# Patient Record
Sex: Male | Born: 1935
Health system: Southern US, Community
[De-identification: ages and names within clinical notes are randomized; demographics above are authoritative.]

## PROBLEM LIST (undated history)

## (undated) DIAGNOSIS — G8929 Other chronic pain: Secondary | ICD-10-CM

## (undated) DIAGNOSIS — I2510 Atherosclerotic heart disease of native coronary artery without angina pectoris: Secondary | ICD-10-CM

## (undated) DIAGNOSIS — N183 Chronic kidney disease, stage 3 unspecified: Secondary | ICD-10-CM

## (undated) DIAGNOSIS — I10 Essential (primary) hypertension: Secondary | ICD-10-CM

## (undated) DIAGNOSIS — E1129 Type 2 diabetes mellitus with other diabetic kidney complication: Secondary | ICD-10-CM

## (undated) DIAGNOSIS — R110 Nausea: Secondary | ICD-10-CM

## (undated) DIAGNOSIS — K219 Gastro-esophageal reflux disease without esophagitis: Secondary | ICD-10-CM

## (undated) DIAGNOSIS — H9190 Unspecified hearing loss, unspecified ear: Secondary | ICD-10-CM

## (undated) DIAGNOSIS — I472 Ventricular tachycardia: Secondary | ICD-10-CM

## (undated) DIAGNOSIS — J069 Acute upper respiratory infection, unspecified: Secondary | ICD-10-CM

## (undated) DIAGNOSIS — H547 Unspecified visual loss: Secondary | ICD-10-CM

## (undated) DIAGNOSIS — E119 Type 2 diabetes mellitus without complications: Secondary | ICD-10-CM

## (undated) DIAGNOSIS — I4729 Other ventricular tachycardia: Secondary | ICD-10-CM

## (undated) DIAGNOSIS — E7800 Pure hypercholesterolemia, unspecified: Secondary | ICD-10-CM

## (undated) DIAGNOSIS — I4430 Unspecified atrioventricular block: Secondary | ICD-10-CM

## (undated) HISTORY — DX: Essential (primary) hypertension: I10

## (undated) HISTORY — DX: Chronic kidney disease, stage 3 (moderate): N18.3

## (undated) HISTORY — DX: Unspecified hearing loss, unspecified ear: H91.90

## (undated) HISTORY — DX: Atherosclerotic heart disease of native coronary artery without angina pectoris: I25.10

## (undated) HISTORY — DX: Pure hypercholesterolemia, unspecified: E78.00

## (undated) HISTORY — DX: Unspecified visual loss: H54.7

## (undated) HISTORY — DX: Acute upper respiratory infection, unspecified: J06.9

## (undated) HISTORY — PX: HERNIA REPAIR: SHX51

## (undated) HISTORY — DX: Gastro-esophageal reflux disease without esophagitis: K21.9

## (undated) HISTORY — DX: Type 2 diabetes mellitus with other diabetic kidney complication: E11.29

## (undated) HISTORY — DX: Chronic kidney disease, stage 3 unspecified: N18.30

## (undated) HISTORY — DX: Type 2 diabetes mellitus without complications: E11.9

## (undated) HISTORY — DX: Other chronic pain: G89.29

## (undated) HISTORY — PX: COLON SURGERY: SHX602

---

## 1980-05-24 HISTORY — PX: FOOT SURGERY: SHX648

## 1997-05-24 DIAGNOSIS — I2510 Atherosclerotic heart disease of native coronary artery without angina pectoris: Secondary | ICD-10-CM

## 1997-05-24 HISTORY — DX: Atherosclerotic heart disease of native coronary artery without angina pectoris: I25.10

## 1997-05-24 HISTORY — PX: TRANSURETHRAL PROSTATECTOMY WITH GYRUS INSTRUMENTS: SHX6153

## 1998-03-20 ENCOUNTER — Encounter: Payer: Self-pay | Admitting: Emergency Medicine

## 1998-03-20 ENCOUNTER — Inpatient Hospital Stay (HOSPITAL_COMMUNITY): Admission: EM | Admit: 1998-03-20 | Discharge: 1998-03-26 | Payer: Self-pay | Admitting: Emergency Medicine

## 1998-03-21 ENCOUNTER — Encounter: Payer: Self-pay | Admitting: *Deleted

## 1998-05-24 HISTORY — PX: SHOULDER SURGERY: SHX246

## 1998-05-24 HISTORY — PX: KNEE ARTHROSCOPY: SUR90

## 1998-07-31 ENCOUNTER — Inpatient Hospital Stay (HOSPITAL_COMMUNITY): Admission: AD | Admit: 1998-07-31 | Discharge: 1998-08-02 | Payer: Self-pay | Admitting: Cardiology

## 1998-08-01 ENCOUNTER — Encounter: Payer: Self-pay | Admitting: Cardiology

## 1998-10-07 ENCOUNTER — Encounter: Payer: Self-pay | Admitting: Emergency Medicine

## 1998-10-07 ENCOUNTER — Emergency Department (HOSPITAL_COMMUNITY): Admission: EM | Admit: 1998-10-07 | Discharge: 1998-10-07 | Payer: Self-pay | Admitting: Emergency Medicine

## 1998-10-17 ENCOUNTER — Encounter: Payer: Self-pay | Admitting: Cardiology

## 1998-10-17 ENCOUNTER — Ambulatory Visit (HOSPITAL_COMMUNITY): Admission: RE | Admit: 1998-10-17 | Discharge: 1998-10-17 | Payer: Self-pay | Admitting: Cardiology

## 2006-05-24 HISTORY — PX: NECK SURGERY: SHX720

## 2008-06-20 ENCOUNTER — Ambulatory Visit (HOSPITAL_COMMUNITY): Admission: RE | Admit: 2008-06-20 | Discharge: 2008-06-20 | Payer: Self-pay | Admitting: Ophthalmology

## 2010-09-07 LAB — BASIC METABOLIC PANEL
BUN: 18 mg/dL (ref 6–23)
CO2: 30 mEq/L (ref 19–32)
Calcium: 9 mg/dL (ref 8.4–10.5)
Chloride: 104 mEq/L (ref 96–112)
Creatinine, Ser: 1.5 mg/dL (ref 0.4–1.5)
GFR calc Af Amer: 56 mL/min — ABNORMAL LOW (ref >60)
GFR calc non Af Amer: 46 mL/min — ABNORMAL LOW (ref >60)
Glucose, Bld: 202 mg/dL — ABNORMAL HIGH (ref 70–99)
Potassium: 4.1 mEq/L (ref 3.5–5.1)
Sodium: 139 mEq/L (ref 135–145)

## 2010-09-07 LAB — HEMOGLOBIN AND HEMATOCRIT, BLOOD: HCT: 42.2 % (ref 39.0–52.0)

## 2010-09-07 LAB — GLUCOSE, CAPILLARY: Glucose-Capillary: 148 mg/dL — ABNORMAL HIGH (ref 70–99)

## 2010-09-09 ENCOUNTER — Ambulatory Visit (HOSPITAL_COMMUNITY)
Admission: RE | Admit: 2010-09-09 | Discharge: 2010-09-09 | Disposition: A | Discharge status: Home / Self Care | Payer: Medicare Other | Source: Ambulatory Visit | Attending: Gastroenterology | Admitting: Gastroenterology

## 2010-09-09 ENCOUNTER — Other Ambulatory Visit: Payer: Self-pay | Admitting: Gastroenterology

## 2010-09-09 DIAGNOSIS — E119 Type 2 diabetes mellitus without complications: Secondary | ICD-10-CM | POA: Insufficient documentation

## 2010-09-09 DIAGNOSIS — E785 Hyperlipidemia, unspecified: Secondary | ICD-10-CM | POA: Insufficient documentation

## 2010-09-09 DIAGNOSIS — Z01810 Encounter for preprocedural cardiovascular examination: Secondary | ICD-10-CM | POA: Insufficient documentation

## 2010-09-09 DIAGNOSIS — Z01812 Encounter for preprocedural laboratory examination: Secondary | ICD-10-CM | POA: Insufficient documentation

## 2010-09-09 DIAGNOSIS — I10 Essential (primary) hypertension: Secondary | ICD-10-CM | POA: Insufficient documentation

## 2010-09-09 DIAGNOSIS — K862 Cyst of pancreas: Secondary | ICD-10-CM | POA: Insufficient documentation

## 2010-09-09 DIAGNOSIS — I252 Old myocardial infarction: Secondary | ICD-10-CM | POA: Insufficient documentation

## 2010-09-09 DIAGNOSIS — R1310 Dysphagia, unspecified: Secondary | ICD-10-CM | POA: Insufficient documentation

## 2010-09-09 DIAGNOSIS — Z79899 Other long term (current) drug therapy: Secondary | ICD-10-CM | POA: Insufficient documentation

## 2010-09-09 DIAGNOSIS — R1011 Right upper quadrant pain: Secondary | ICD-10-CM | POA: Insufficient documentation

## 2010-09-09 DIAGNOSIS — K219 Gastro-esophageal reflux disease without esophagitis: Secondary | ICD-10-CM | POA: Insufficient documentation

## 2010-09-09 DIAGNOSIS — Z7982 Long term (current) use of aspirin: Secondary | ICD-10-CM | POA: Insufficient documentation

## 2010-09-09 DIAGNOSIS — D371 Neoplasm of uncertain behavior of stomach: Secondary | ICD-10-CM | POA: Insufficient documentation

## 2010-09-09 LAB — GLUCOSE, CAPILLARY
Glucose-Capillary: 124 mg/dL — ABNORMAL HIGH (ref 70–99)
Glucose-Capillary: 69 mg/dL — ABNORMAL LOW (ref 70–99)
Glucose-Capillary: 156 mg/dL — ABNORMAL HIGH (ref 70–99)

## 2010-11-09 ENCOUNTER — Other Ambulatory Visit (HOSPITAL_COMMUNITY): Payer: Self-pay | Admitting: Gastroenterology

## 2010-11-20 ENCOUNTER — Encounter (HOSPITAL_COMMUNITY)
Admission: RE | Admit: 2010-11-20 | Discharge: 2010-11-20 | Disposition: A | Discharge status: Home / Self Care | Payer: Medicare Other | Source: Ambulatory Visit | Attending: Gastroenterology | Admitting: Gastroenterology

## 2010-11-20 DIAGNOSIS — E119 Type 2 diabetes mellitus without complications: Secondary | ICD-10-CM | POA: Insufficient documentation

## 2010-11-20 DIAGNOSIS — R110 Nausea: Secondary | ICD-10-CM | POA: Insufficient documentation

## 2010-11-20 DIAGNOSIS — R109 Unspecified abdominal pain: Secondary | ICD-10-CM | POA: Insufficient documentation

## 2010-11-26 ENCOUNTER — Encounter (HOSPITAL_COMMUNITY): Admission: RE | Admit: 2010-11-26 | Payer: Medicare Other | Source: Ambulatory Visit

## 2010-11-26 ENCOUNTER — Encounter (HOSPITAL_COMMUNITY)
Admission: RE | Admit: 2010-11-26 | Discharge: 2010-11-26 | Disposition: A | Discharge status: Home / Self Care | Payer: Medicare Other | Source: Ambulatory Visit | Attending: Gastroenterology | Admitting: Gastroenterology

## 2010-11-26 ENCOUNTER — Other Ambulatory Visit (HOSPITAL_COMMUNITY): Payer: Self-pay | Admitting: Gastroenterology

## 2010-11-26 ENCOUNTER — Encounter (HOSPITAL_COMMUNITY): Payer: Self-pay

## 2010-11-26 HISTORY — DX: Nausea: R11.0

## 2010-11-26 MED ORDER — TECHNETIUM TC 99M MEBROFENIN IV KIT
5.4000 | PACK | Freq: Once | INTRAVENOUS | Status: AC | PRN
  Administered 2010-11-26: 5.4 via INTRAVENOUS

## 2010-11-26 MED ORDER — SINCALIDE 5 MCG IJ SOLR: 0.0200 ug/kg | Freq: Once | INTRAMUSCULAR | Status: DC

## 2011-02-12 ENCOUNTER — Other Ambulatory Visit: Payer: Self-pay | Admitting: Gastroenterology

## 2012-01-17 ENCOUNTER — Ambulatory Visit: Payer: Medicare Other | Attending: Family Medicine | Admitting: Physical Therapy

## 2012-01-17 DIAGNOSIS — M542 Cervicalgia: Secondary | ICD-10-CM | POA: Insufficient documentation

## 2012-01-17 DIAGNOSIS — IMO0001 Reserved for inherently not codable concepts without codable children: Secondary | ICD-10-CM | POA: Insufficient documentation

## 2012-01-17 DIAGNOSIS — R42 Dizziness and giddiness: Secondary | ICD-10-CM | POA: Insufficient documentation

## 2012-01-17 DIAGNOSIS — M2569 Stiffness of other specified joint, not elsewhere classified: Secondary | ICD-10-CM | POA: Insufficient documentation

## 2012-01-21 ENCOUNTER — Ambulatory Visit: Payer: Medicare Other | Admitting: Physical Therapy

## 2012-01-26 ENCOUNTER — Ambulatory Visit: Payer: Medicare Other | Attending: Family Medicine | Admitting: Physical Therapy

## 2012-01-26 DIAGNOSIS — R42 Dizziness and giddiness: Secondary | ICD-10-CM | POA: Insufficient documentation

## 2012-01-26 DIAGNOSIS — M2569 Stiffness of other specified joint, not elsewhere classified: Secondary | ICD-10-CM | POA: Insufficient documentation

## 2012-01-26 DIAGNOSIS — M542 Cervicalgia: Secondary | ICD-10-CM | POA: Insufficient documentation

## 2012-01-26 DIAGNOSIS — IMO0001 Reserved for inherently not codable concepts without codable children: Secondary | ICD-10-CM | POA: Insufficient documentation

## 2012-01-28 ENCOUNTER — Ambulatory Visit: Payer: Medicare Other | Admitting: Physical Therapy

## 2012-02-02 ENCOUNTER — Ambulatory Visit: Payer: Medicare Other | Admitting: Physical Therapy

## 2012-02-04 ENCOUNTER — Ambulatory Visit: Payer: Medicare Other | Admitting: Physical Therapy

## 2012-02-08 ENCOUNTER — Ambulatory Visit: Payer: Medicare Other | Admitting: Physical Therapy

## 2012-02-15 ENCOUNTER — Ambulatory Visit: Payer: Medicare Other | Admitting: Physical Therapy

## 2012-02-22 ENCOUNTER — Ambulatory Visit: Payer: Medicare Other | Attending: Family Medicine | Admitting: Physical Therapy

## 2012-02-22 DIAGNOSIS — M2569 Stiffness of other specified joint, not elsewhere classified: Secondary | ICD-10-CM | POA: Insufficient documentation

## 2012-02-22 DIAGNOSIS — M542 Cervicalgia: Secondary | ICD-10-CM | POA: Insufficient documentation

## 2012-02-22 DIAGNOSIS — R42 Dizziness and giddiness: Secondary | ICD-10-CM | POA: Insufficient documentation

## 2012-02-22 DIAGNOSIS — IMO0001 Reserved for inherently not codable concepts without codable children: Secondary | ICD-10-CM | POA: Insufficient documentation

## 2012-05-24 HISTORY — PX: PACEMAKER INSERTION: SHX728

## 2012-11-02 ENCOUNTER — Encounter: Payer: Self-pay | Admitting: Diagnostic Neuroimaging

## 2012-11-03 ENCOUNTER — Ambulatory Visit: Payer: Medicare Other | Admitting: Diagnostic Neuroimaging

## 2012-11-21 ENCOUNTER — Ambulatory Visit: Payer: Medicare Other | Admitting: Diagnostic Neuroimaging

## 2017-03-14 ENCOUNTER — Ambulatory Visit: Payer: Medicare Other | Admitting: Neurology

## 2018-01-09 ENCOUNTER — Encounter: Payer: Self-pay | Admitting: Family Medicine

## 2018-01-09 ENCOUNTER — Ambulatory Visit (INDEPENDENT_AMBULATORY_CARE_PROVIDER_SITE_OTHER): Payer: Medicare Other | Admitting: Family Medicine

## 2018-01-09 VITALS — BP 108/67 | Temp 97.0°F | Ht 72.0 in | Wt 216.1 lb

## 2018-01-09 DIAGNOSIS — Z125 Encounter for screening for malignant neoplasm of prostate: Secondary | ICD-10-CM | POA: Diagnosis not present

## 2018-01-09 DIAGNOSIS — E7800 Pure hypercholesterolemia, unspecified: Secondary | ICD-10-CM | POA: Diagnosis not present

## 2018-01-09 DIAGNOSIS — G119 Hereditary ataxia, unspecified: Secondary | ICD-10-CM | POA: Diagnosis not present

## 2018-01-09 DIAGNOSIS — E1140 Type 2 diabetes mellitus with diabetic neuropathy, unspecified: Secondary | ICD-10-CM | POA: Diagnosis not present

## 2018-01-09 DIAGNOSIS — E1149 Type 2 diabetes mellitus with other diabetic neurological complication: Secondary | ICD-10-CM

## 2018-01-09 DIAGNOSIS — Z950 Presence of cardiac pacemaker: Secondary | ICD-10-CM

## 2018-01-09 DIAGNOSIS — I25118 Atherosclerotic heart disease of native coronary artery with other forms of angina pectoris: Secondary | ICD-10-CM

## 2018-01-09 LAB — BAYER DCA HB A1C WAIVED: HB A1C: 7.3 % — AB (ref <7.0)

## 2018-01-09 MED ORDER — CELECOXIB 200 MG PO CAPS: 200.0000 mg | ORAL_CAPSULE | Freq: Every day | ORAL | 2 refills | Status: DC

## 2018-01-09 MED ORDER — PRAVASTATIN SODIUM 20 MG PO TABS: 20.0000 mg | ORAL_TABLET | Freq: Every day | ORAL | 1 refills | Status: DC

## 2018-01-09 MED ORDER — HYDROCHLOROTHIAZIDE 25 MG PO TABS: 25.0000 mg | ORAL_TABLET | Freq: Every day | ORAL | 1 refills | Status: DC

## 2018-01-09 MED ORDER — INSULIN DETEMIR 100 UNIT/ML ~~LOC~~ SOLN: 50.0000 [IU] | SUBCUTANEOUS | 1 refills | Status: DC

## 2018-01-09 MED ORDER — EZETIMIBE 10 MG PO TABS: 10.0000 mg | ORAL_TABLET | Freq: Every day | ORAL | 1 refills | Status: DC

## 2018-01-09 MED ORDER — GLIPIZIDE 10 MG PO TABS: 10.0000 mg | ORAL_TABLET | Freq: Two times a day (BID) | ORAL | 1 refills | Status: DC

## 2018-01-09 MED ORDER — LISINOPRIL 10 MG PO TABS: ORAL_TABLET | ORAL | 1 refills | Status: DC

## 2018-01-09 NOTE — Progress Notes (Signed)
Subjective:  Patient ID: Thomas Maldonado, male    DOB: 1934/04/17  Age: 84 y.o. MRN: [id number]  CC: New Patient (Initial Visit) (pt here today to establish care)   HPI Thomas Maldonado presents for new patient office visit.  He has been seen over the last few years by multiple physicians for dizziness and headache.  No body aches but not able to determine the cause or offer treatment that has been useful.  He has had multiple tests for this and is very frustrated and that it has limited his lifestyle.  He takes oxycodone for chronic pain.  This pain is primarily in the neck and he has had a fusion and laminectomy in the cervical spine region.  That pain is very well relieved he states but the headache radiates from the neck and up into the posterior scalp and further all the way to the frontal scalp.  The headaches are rather constant.  They are made worse when he when he sits up from laying position.  He does seem to get some relief from laying down.  Of note is that the dizziness accompanies the headache when he leans forward and sits up.  The dizziness is described as an off-balance sensation he feels swimmy headed feels like he is going to fall over.  He says that sometimes it feels like things are moving but generally not.  Headache is moderately severe it is a pressure.  He notes that when he tries to eat something his headache and dizziness will actually get worse.  That is frequently accompanied by some nausea.  Patient has tried going off of the oxycodone.  That has not helped.  He resumed the medicine due to the intensity of his arthritis pain.  Additionally he has had multiple scans.  He cannot have an MRI due to pacemaker placement 5 years ago.  He has had a CT angiogram of the brain approximately 3 years ago.  That report was reviewed showing essentially normal vertebrals as well as intracranial circulation.  The patient was having similar symptoms at that time according to the indication for the  test. He stopped smoking and alcohol 35 years ago.  With regard to the patient's diabetes this has been stable.  He takes 50 units of Levemir daily.  He also has NovoLog to take.  He uses it as needed 6 units for sugar over 250 and 10 units for sugar over 300.  That happens 2-3 times a week apparently.  He denies any low blood sugars but comments that today he has not eaten and he is feeling like his sugar might drop if he does not get something to eat soon.He is not following a strict diabetic diet at this point.  Depression screen PHQ 2/9 01/09/2018  Decreased Interest 0  Down, Depressed, Hopeless 1  PHQ - 2 Score 1    History Thomas Maldonado has a past medical history of Abdominal pain, Acute upper respiratory infections of unspecified site, Chronic kidney disease, stage III (moderate) ([place]), Coronary atherosclerosis of unspecified type of vessel, native or graft (1999), Diabetes mellitus without complication ([place]), Dizziness and giddiness (2007), Esophageal reflux, Headache(784.0), Heart disease, unspecified, History of colonoscopy (02/2011), Hypertension, Myocardial infarction ([place]), Nausea, Other chronic pain, Problems with hearing, Problems with sight, Pure hypercholesterolemia, Type II or unspecified type diabetes mellitus with neurological manifestations, not stated as uncontrolled(250.60), and Type II or unspecified type diabetes mellitus with renal manifestations, not stated as uncontrolled(250.40).   He has a past surgical  history that includes Neck surgery (2008); Shoulder surgery (2000); Knee arthroscopy (Right, 2000); Transurethral prostatectomy with gyrus instruments (1999); Foot surgery (Left, 1982); Hernia repair; Colon surgery; and Pacemaker insertion (2014).   His family history includes Cancer in his brother, brother, brother, brother, brother, brother, sister, sister, sister, and sister; Diabetes in his brother, brother, brother, father, sister, and sister; Heart disease in his brother,  brother, brother, brother, brother, brother, sister, sister, sister, and sister.He reports that he quit smoking about 36 years ago. His smoking use included cigarettes. He has never used smokeless tobacco. He reports that he does not drink alcohol or use drugs.    ROS Review of Systems  Constitutional: Positive for appetite change. Negative for fatigue and unexpected weight change.  HENT: Negative.   Eyes: Negative for visual disturbance.  Respiratory: Negative for cough and shortness of breath.   Cardiovascular: Negative for chest pain and leg swelling.  Gastrointestinal: Negative for abdominal pain, diarrhea, nausea and vomiting.  Genitourinary: Negative for difficulty urinating.  Musculoskeletal: Negative for arthralgias and myalgias.  Skin: Negative for rash.  Neurological: Positive for dizziness, light-headedness and headaches.  Psychiatric/Behavioral: Negative for sleep disturbance.    Objective:  BP 108/67   Temp (!) 97 F (36.1 C) (Oral)   Ht 6' (1.829 m)   Wt 216 lb 2 oz (98 kg)   BMI 29.31 kg/m   BP Readings from Last 3 Encounters:  01/09/18 108/67    Wt Readings from Last 3 Encounters:  01/09/18 216 lb 2 oz (98 kg)     Physical Exam  Constitutional: He is oriented to person, place, and time. He appears well-developed and well-nourished. No distress.  HENT:  Head: Normocephalic and atraumatic.  Right Ear: External ear normal.  Left Ear: External ear normal.  Nose: Nose normal.  Mouth/Throat: Oropharynx is clear and moist.  Eyes: Pupils are equal, round, and reactive to light. Conjunctivae and EOM are normal.  Neck: Normal range of motion. Neck supple.  Cardiovascular: Normal rate, regular rhythm and normal heart sounds.  No murmur heard. Pulmonary/Chest: Effort normal and breath sounds normal. No respiratory distress. He has no wheezes. He has no rales.  Abdominal: Soft. There is no tenderness.  Musculoskeletal: Normal range of motion.  Neurological: He  is alert and oriented to person, place, and time. He has normal reflexes.  Patient is very articulate.  No signs of memory impairment or loss of cognition.  This is based on extensive interview.  Both with the patient and the daughter who is accompanying and gives supplemental history.  Skin: Skin is warm and dry.  Psychiatric: He has a normal mood and affect. His behavior is normal. Judgment and thought content normal.      Assessment & Plan:   Thomas Maldonado was seen today for new patient (initial visit).  Diagnoses and all orders for this visit:  Cerebral ataxia ([place]) -     Ambulatory referral to Neurology -     Folate -     Vitamin B12 -     VITAMIN D 25 Hydroxy (Vit-D Deficiency, Fractures)  Diabetic neuropathy with neurologic complication ([place]) -     CBC with Differential/Platelet -     CMP14+EGFR -     Bayer DCA Hb A1c Waived -     Folate -     Vitamin B12 -     VITAMIN D 25 Hydroxy (Vit-D Deficiency, Fractures)  Pure hypercholesterolemia -     Lipid panel  Special screening for malignant neoplasm  of prostate -     PSA, total and free  Coronary artery disease of native heart with stable angina pectoris, unspecified vessel or lesion type [facility])  Pacemaker  Other orders -     celecoxib (CELEBREX) 200 MG capsule; Take 1 capsule (200 mg total) by mouth daily. -     ezetimibe (ZETIA) 10 MG tablet; Take 1 tablet (10 mg total) by mouth daily. -     glipiZIDE (GLUCOTROL) 10 MG tablet; Take 1 tablet (10 mg total) by mouth 2 (two) times daily before a meal. -     hydrochlorothiazide (HYDRODIURIL) 25 MG tablet; Take 1 tablet (25 mg total) by mouth daily. -     insulin detemir (LEVEMIR) 100 UNIT/ML injection; Inject 0.5 mLs (50 Units total) into the skin every morning. -     lisinopril (PRINIVIL,ZESTRIL) 10 MG tablet; Take 1 tablet QAM and 1/2 tablet QPM -     pravastatin (PRAVACHOL) 20 MG tablet; Take 1 tablet (20 mg total) by mouth daily.       I have discontinued Thomas  Maldonado's bisoprolol, prasugrel, pregabalin, lisinopril, traMADol, pravastatin, and omeprazole. I have also changed his ezetimibe, glipiZIDE, hydrochlorothiazide, insulin detemir, lisinopril, and pravastatin. Additionally, I am having him start on celecoxib. Lastly, I am having him maintain his aspirin, insulin regular, ondansetron, oxyCODONE-acetaminophen, and oxyCODONE-acetaminophen.  Allergies as of 01/09/2018      Reactions   Metformin And Related Nausea Only   nausea nausea nausea nausea   Prednisone Anxiety   Pt reports "with all steriods gets anxious, tremors, and tears me all to pieces" Pt reports "with all steriods gets anxious, tremors, and tears me all to pieces" Pt reports "with all steriods gets anxious, tremors, and tears me all to pieces"      Medication List        Accurate as of 01/09/18  2:58 PM. Always use your most recent med list.          aspirin 81 MG tablet Take 81 mg by mouth daily.   celecoxib 200 MG capsule Commonly known as:  CELEBREX Take 1 capsule (200 mg total) by mouth daily.   ezetimibe 10 MG tablet Commonly known as:  ZETIA Take 1 tablet (10 mg total) by mouth daily.   glipiZIDE 10 MG tablet Commonly known as:  GLUCOTROL Take 1 tablet (10 mg total) by mouth 2 (two) times daily before a meal.   HUMULIN R 100 units/mL injection Generic drug:  insulin regular INJECT 6 TO 10 UNITS UNDER THE SKIN TWICE DAILY, BEFORE MEALS   hydrochlorothiazide 25 MG tablet Commonly known as:  HYDRODIURIL Take 1 tablet (25 mg total) by mouth daily.   insulin detemir 100 UNIT/ML injection Commonly known as:  LEVEMIR Inject 0.5 mLs (50 Units total) into the skin every morning.   lisinopril 10 MG tablet Commonly known as:  PRINIVIL,ZESTRIL Take 1 tablet QAM and 1/2 tablet QPM   ondansetron 4 MG tablet Commonly known as:  ZOFRAN TAKE 1 TABLET BY MOUTH EVERY 8 HOURS AS NEEDED FOR NAUSEA   oxyCODONE-acetaminophen 5-325 MG tablet Commonly known as:   PERCOCET/ROXICET Take by mouth.   oxyCODONE-acetaminophen 10-325 MG tablet Commonly known as:  PERCOCET TK 1 T PO Q 6 H PRN P   pravastatin 20 MG tablet Commonly known as:  PRAVACHOL Take 1 tablet (20 mg total) by mouth daily.      The patient was seen for over 1 hour.  During that time extensive interview and counseling took  place regarding his cerebellar ataxia and the limited treatment for that available in addition to its interaction with his diabetes and the use of pain meds.  Follow-up: No follow-ups on file.  Claretta Fraise, M.D.

## 2018-01-09 NOTE — Patient Instructions (Signed)

## 2018-01-10 ENCOUNTER — Encounter: Payer: Self-pay | Admitting: Family Medicine

## 2018-01-10 ENCOUNTER — Telehealth: Payer: Self-pay | Admitting: Family Medicine

## 2018-01-10 DIAGNOSIS — D721 Eosinophilia, unspecified: Secondary | ICD-10-CM | POA: Insufficient documentation

## 2018-01-10 LAB — CBC WITH DIFFERENTIAL/PLATELET
BASOS ABS: 0.1 10*3/uL (ref 0.0–0.2)
BASOS: 1 %
EOS (ABSOLUTE): 2.3 10*3/uL — AB (ref 0.0–0.4)
Eos: 25 %
HEMATOCRIT: 42.2 % (ref 37.5–51.0)
Hemoglobin: 14 g/dL (ref 13.0–17.7)
Immature Grans (Abs): 0 10*3/uL (ref 0.0–0.1)
Immature Granulocytes: 0 %
LYMPHS ABS: 3.1 10*3/uL (ref 0.7–3.1)
Lymphs: 34 %
MCH: 30.8 pg (ref 26.6–33.0)
MCHC: 33.2 g/dL (ref 31.5–35.7)
MCV: 93 fL (ref 79–97)
MONOS ABS: 0.6 10*3/uL (ref 0.1–0.9)
Monocytes: 6 %
NEUTROS ABS: 3.1 10*3/uL (ref 1.4–7.0)
Neutrophils: 34 %
PLATELETS: 150 10*3/uL (ref 150–450)
RBC: 4.55 x10E6/uL (ref 4.14–5.80)
RDW: 12.6 % (ref 12.3–15.4)
WBC: 9.1 10*3/uL (ref 3.4–10.8)

## 2018-01-10 LAB — LIPID PANEL
CHOL/HDL RATIO: 3.2 ratio (ref 0.0–5.0)
Cholesterol, Total: 112 mg/dL (ref 100–199)
HDL: 35 mg/dL — AB (ref >39)
LDL Calculated: 58 mg/dL (ref 0–99)
Triglycerides: 96 mg/dL (ref 0–149)
VLDL Cholesterol Cal: 19 mg/dL (ref 5–40)

## 2018-01-10 LAB — CMP14+EGFR
A/G RATIO: 1.6 (ref 1.2–2.2)
ALT: 23 IU/L (ref 0–44)
AST: 22 IU/L (ref 0–40)
Albumin: 4.2 g/dL (ref 3.5–4.7)
Alkaline Phosphatase: 87 IU/L (ref 39–117)
BILIRUBIN TOTAL: 0.2 mg/dL (ref 0.0–1.2)
BUN/Creatinine Ratio: 15 (ref 10–24)
BUN: 24 mg/dL (ref 8–27)
CALCIUM: 9.2 mg/dL (ref 8.6–10.2)
CHLORIDE: 102 mmol/L (ref 96–106)
CO2: 26 mmol/L (ref 20–29)
Creatinine, Ser: 1.6 mg/dL — ABNORMAL HIGH (ref 0.76–1.27)
GFR, EST AFRICAN AMERICAN: 46 mL/min/{1.73_m2} — AB (ref >59)
GFR, EST NON AFRICAN AMERICAN: 40 mL/min/{1.73_m2} — AB (ref >59)
GLOBULIN, TOTAL: 2.6 g/dL (ref 1.5–4.5)
Glucose: 142 mg/dL — ABNORMAL HIGH (ref 65–99)
POTASSIUM: 4.6 mmol/L (ref 3.5–5.2)
SODIUM: 140 mmol/L (ref 134–144)
TOTAL PROTEIN: 6.8 g/dL (ref 6.0–8.5)

## 2018-01-10 LAB — PSA, TOTAL AND FREE
PROSTATE SPECIFIC AG, SERUM: 1 ng/mL (ref 0.0–4.0)
PSA FREE PCT: 33 %
PSA, Free: 0.33 ng/mL

## 2018-01-10 NOTE — Telephone Encounter (Signed)
PT was seen as a new pt and forget to let us know that he also takes Omeprazole 20 MG Capsule Once in Morning and Once in Afternoon. He doesn't need any refills right  Now but wanted [place] to know that he was taking this.

## 2018-01-10 NOTE — Telephone Encounter (Signed)
Medication added to med list.

## 2018-01-12 LAB — VITAMIN D 25 HYDROXY (VIT D DEFICIENCY, FRACTURES): Vit D, 25-Hydroxy: 23.3 ng/mL — ABNORMAL LOW (ref 30.0–100.0)

## 2018-01-12 LAB — SPECIMEN STATUS REPORT

## 2018-01-12 LAB — VITAMIN B12: Vitamin B-12: 249 pg/mL (ref 232–1245)

## 2018-01-12 LAB — FOLATE: FOLATE: 7.9 ng/mL (ref >3.0)

## 2018-01-16 ENCOUNTER — Other Ambulatory Visit: Payer: Self-pay

## 2018-01-16 MED ORDER — VITAMIN D (ERGOCALCIFEROL) 1.25 MG (50000 UNIT) PO CAPS: 50000.0000 [IU] | ORAL_CAPSULE | ORAL | 1 refills | Status: DC

## 2018-01-25 DIAGNOSIS — M479 Spondylosis, unspecified: Secondary | ICD-10-CM | POA: Diagnosis not present

## 2018-01-25 DIAGNOSIS — M47812 Spondylosis without myelopathy or radiculopathy, cervical region: Secondary | ICD-10-CM | POA: Diagnosis not present

## 2018-01-25 DIAGNOSIS — Z79899 Other long term (current) drug therapy: Secondary | ICD-10-CM | POA: Diagnosis not present

## 2018-01-25 DIAGNOSIS — M47816 Spondylosis without myelopathy or radiculopathy, lumbar region: Secondary | ICD-10-CM | POA: Diagnosis not present

## 2018-01-30 DIAGNOSIS — E538 Deficiency of other specified B group vitamins: Secondary | ICD-10-CM | POA: Diagnosis not present

## 2018-01-30 DIAGNOSIS — M5481 Occipital neuralgia: Secondary | ICD-10-CM | POA: Diagnosis not present

## 2018-01-30 DIAGNOSIS — I951 Orthostatic hypotension: Secondary | ICD-10-CM | POA: Diagnosis not present

## 2018-01-30 DIAGNOSIS — R42 Dizziness and giddiness: Secondary | ICD-10-CM | POA: Diagnosis not present

## 2018-02-09 DIAGNOSIS — M4802 Spinal stenosis, cervical region: Secondary | ICD-10-CM | POA: Diagnosis not present

## 2018-02-13 ENCOUNTER — Telehealth: Payer: Self-pay | Admitting: Family Medicine

## 2018-02-13 MED ORDER — PRAVASTATIN SODIUM 20 MG PO TABS: 20.0000 mg | ORAL_TABLET | Freq: Every day | ORAL | 0 refills | Status: DC

## 2018-02-13 NOTE — Telephone Encounter (Signed)
30 day supply sent, daughter aware

## 2018-02-13 NOTE — Telephone Encounter (Signed)
Mail order messed up his delivery date needs rosuvastatin (CRESTOR) 20 MG tablet sent to [facility] call daughter when done. Please advise

## 2018-02-20 ENCOUNTER — Ambulatory Visit (INDEPENDENT_AMBULATORY_CARE_PROVIDER_SITE_OTHER): Payer: Medicare Other | Admitting: Family Medicine

## 2018-02-20 ENCOUNTER — Encounter: Payer: Self-pay | Admitting: Family Medicine

## 2018-02-20 VITALS — BP 116/73 | HR 73 | Temp 97.4°F | Ht 72.0 in | Wt 216.0 lb

## 2018-02-20 DIAGNOSIS — G119 Hereditary ataxia, unspecified: Secondary | ICD-10-CM | POA: Diagnosis not present

## 2018-02-20 MED ORDER — OMEPRAZOLE 20 MG PO CPDR: 40.0000 mg | DELAYED_RELEASE_CAPSULE | Freq: Every day | ORAL | 1 refills | Status: DC

## 2018-02-20 NOTE — Progress Notes (Signed)
No chief complaint on file.   HPI  Patient presents today for unchanged symptoms.  He is still having headaches.  He is under work-up by Dr. at [facility] for his cerebellar ataxia which causes the headaches in addition to dizziness.  He had a CT of the head and is seeing Dr. Berdine Addison in 3 days to go over the results and a plan of care.  Patient tells me today that he cannot tolerate the vitamin B12.  It makes him feel washed out and makes his headache worse.  He has had 3 injections and has had the same problem for 2 to 3 days after each. PMH: Smoking status noted ROS: Per HPI  Objective: BP 116/73   Pulse 73   Temp (!) 97.4 F (36.3 C) (Oral)   Ht 6' (1.829 m)   Wt 216 lb (98 kg)   BMI 29.29 kg/m  Gen: NAD, alert, cooperative with exam HEENT: NCAT, EOMI, PERRL CV: RRR, good S1/S2, no murmur Resp: CTABL, no wheezes, non-labored Abd: SNTND, BS present, no guarding or organomegaly Ext: No edema, warm Neuro: Alert and oriented, No gross deficits  Assessment and plan:  1. Cerebral ataxia ([place])     Meds ordered this encounter  Medications  . omeprazole (PRILOSEC) 20 MG capsule    Sig: Take 2 capsules (40 mg total) by mouth daily.    Dispense:  180 capsule    Refill:  1    He is in close follow-up with Dr. at [facility] and his problems seem to be related to Dr. Karren Burly area of expertise.  Therefore I will hold off on any new treatments since he is checking in with Dr. Berdine Addison in just 3 days. Follow up in 6 weeks Claretta Fraise, MD

## 2018-02-23 ENCOUNTER — Encounter: Payer: Self-pay | Admitting: Family Medicine

## 2018-02-23 DIAGNOSIS — I951 Orthostatic hypotension: Secondary | ICD-10-CM | POA: Diagnosis not present

## 2018-02-23 DIAGNOSIS — M5481 Occipital neuralgia: Secondary | ICD-10-CM | POA: Diagnosis not present

## 2018-02-23 DIAGNOSIS — R42 Dizziness and giddiness: Secondary | ICD-10-CM | POA: Diagnosis not present

## 2018-02-23 DIAGNOSIS — E538 Deficiency of other specified B group vitamins: Secondary | ICD-10-CM | POA: Diagnosis not present

## 2018-02-23 DIAGNOSIS — M5412 Radiculopathy, cervical region: Secondary | ICD-10-CM | POA: Diagnosis not present

## 2018-03-03 DIAGNOSIS — M542 Cervicalgia: Secondary | ICD-10-CM | POA: Diagnosis not present

## 2018-03-03 DIAGNOSIS — E538 Deficiency of other specified B group vitamins: Secondary | ICD-10-CM | POA: Diagnosis not present

## 2018-03-03 DIAGNOSIS — R42 Dizziness and giddiness: Secondary | ICD-10-CM | POA: Diagnosis not present

## 2018-03-03 DIAGNOSIS — M5481 Occipital neuralgia: Secondary | ICD-10-CM | POA: Diagnosis not present

## 2018-03-06 ENCOUNTER — Telehealth: Payer: Self-pay | Admitting: Family Medicine

## 2018-03-06 NOTE — Telephone Encounter (Signed)
Can we send a month supply of insulin regular (HUMULIN R) 100 units/mL injection [facility] pt is about out and it will take [facility] 5[facility]7 days to get them some Please [facility] for the insulin regular (HUMULIN R) 100 units/mL injection as well

## 2018-03-07 MED ORDER — INSULIN REGULAR HUMAN 100 UNIT/ML IJ SOLN: INTRAMUSCULAR | 0 refills | Status: DC

## 2018-03-07 NOTE — Telephone Encounter (Signed)
Thomas Maldonado notified that rx sent to [facility] and also mail order

## 2018-03-27 DIAGNOSIS — M47812 Spondylosis without myelopathy or radiculopathy, cervical region: Secondary | ICD-10-CM | POA: Diagnosis not present

## 2018-03-27 DIAGNOSIS — Z79899 Other long term (current) drug therapy: Secondary | ICD-10-CM | POA: Diagnosis not present

## 2018-03-27 DIAGNOSIS — M479 Spondylosis, unspecified: Secondary | ICD-10-CM | POA: Diagnosis not present

## 2018-03-27 DIAGNOSIS — M47816 Spondylosis without myelopathy or radiculopathy, lumbar region: Secondary | ICD-10-CM | POA: Diagnosis not present

## 2018-04-03 ENCOUNTER — Encounter: Payer: Self-pay | Admitting: Family Medicine

## 2018-04-03 ENCOUNTER — Ambulatory Visit (INDEPENDENT_AMBULATORY_CARE_PROVIDER_SITE_OTHER): Payer: Medicare Other | Admitting: Family Medicine

## 2018-04-03 VITALS — BP 118/71 | HR 70 | Temp 97.1°F | Ht 72.0 in | Wt 220.2 lb

## 2018-04-03 DIAGNOSIS — Z950 Presence of cardiac pacemaker: Secondary | ICD-10-CM | POA: Diagnosis not present

## 2018-04-03 DIAGNOSIS — R42 Dizziness and giddiness: Secondary | ICD-10-CM | POA: Diagnosis not present

## 2018-04-03 DIAGNOSIS — E1140 Type 2 diabetes mellitus with diabetic neuropathy, unspecified: Secondary | ICD-10-CM | POA: Diagnosis not present

## 2018-04-03 DIAGNOSIS — Z955 Presence of coronary angioplasty implant and graft: Secondary | ICD-10-CM | POA: Diagnosis not present

## 2018-04-03 DIAGNOSIS — I10 Essential (primary) hypertension: Secondary | ICD-10-CM | POA: Diagnosis not present

## 2018-04-03 DIAGNOSIS — I2510 Atherosclerotic heart disease of native coronary artery without angina pectoris: Secondary | ICD-10-CM | POA: Diagnosis not present

## 2018-04-03 DIAGNOSIS — R51 Headache: Secondary | ICD-10-CM | POA: Diagnosis not present

## 2018-04-03 DIAGNOSIS — Z23 Encounter for immunization: Secondary | ICD-10-CM | POA: Diagnosis not present

## 2018-04-03 DIAGNOSIS — Z794 Long term (current) use of insulin: Secondary | ICD-10-CM | POA: Diagnosis not present

## 2018-04-03 DIAGNOSIS — E1149 Type 2 diabetes mellitus with other diabetic neurological complication: Secondary | ICD-10-CM | POA: Diagnosis not present

## 2018-04-03 DIAGNOSIS — R531 Weakness: Secondary | ICD-10-CM | POA: Diagnosis not present

## 2018-04-03 DIAGNOSIS — R0781 Pleurodynia: Secondary | ICD-10-CM

## 2018-04-03 DIAGNOSIS — Z7982 Long term (current) use of aspirin: Secondary | ICD-10-CM | POA: Diagnosis not present

## 2018-04-03 DIAGNOSIS — I25118 Atherosclerotic heart disease of native coronary artery with other forms of angina pectoris: Secondary | ICD-10-CM | POA: Diagnosis not present

## 2018-04-03 DIAGNOSIS — I959 Hypotension, unspecified: Secondary | ICD-10-CM | POA: Diagnosis not present

## 2018-04-03 DIAGNOSIS — E1165 Type 2 diabetes mellitus with hyperglycemia: Secondary | ICD-10-CM | POA: Diagnosis not present

## 2018-04-03 DIAGNOSIS — E119 Type 2 diabetes mellitus without complications: Secondary | ICD-10-CM | POA: Diagnosis not present

## 2018-04-03 DIAGNOSIS — Z79899 Other long term (current) drug therapy: Secondary | ICD-10-CM | POA: Diagnosis not present

## 2018-04-03 DIAGNOSIS — R262 Difficulty in walking, not elsewhere classified: Secondary | ICD-10-CM | POA: Diagnosis not present

## 2018-04-03 DIAGNOSIS — R110 Nausea: Secondary | ICD-10-CM | POA: Diagnosis not present

## 2018-04-03 DIAGNOSIS — R55 Syncope and collapse: Secondary | ICD-10-CM | POA: Diagnosis not present

## 2018-04-03 LAB — BAYER DCA HB A1C WAIVED: HB A1C (BAYER DCA - WAIVED): 7.8 % — ABNORMAL HIGH (ref <7.0)

## 2018-04-03 MED ORDER — HYDROCHLOROTHIAZIDE 25 MG PO TABS: 25.0000 mg | ORAL_TABLET | Freq: Every day | ORAL | 1 refills | Status: DC

## 2018-04-03 MED ORDER — EZETIMIBE 10 MG PO TABS: 10.0000 mg | ORAL_TABLET | Freq: Every day | ORAL | 1 refills | Status: DC

## 2018-04-03 MED ORDER — OMEPRAZOLE 20 MG PO CPDR: 40.0000 mg | DELAYED_RELEASE_CAPSULE | Freq: Every day | ORAL | 1 refills | Status: DC

## 2018-04-03 MED ORDER — VITAMIN D (ERGOCALCIFEROL) 1.25 MG (50000 UNIT) PO CAPS: 50000.0000 [IU] | ORAL_CAPSULE | ORAL | 1 refills | Status: DC

## 2018-04-03 MED ORDER — INSULIN DETEMIR 100 UNIT/ML ~~LOC~~ SOLN: 50.0000 [IU] | SUBCUTANEOUS | 1 refills | Status: DC

## 2018-04-03 MED ORDER — PRAVASTATIN SODIUM 20 MG PO TABS: 20.0000 mg | ORAL_TABLET | Freq: Every day | ORAL | 1 refills | Status: DC

## 2018-04-03 MED ORDER — GLIPIZIDE 10 MG PO TABS: 10.0000 mg | ORAL_TABLET | Freq: Two times a day (BID) | ORAL | 1 refills | Status: DC

## 2018-04-03 MED ORDER — INSULIN REGULAR HUMAN 100 UNIT/ML IJ SOLN: INTRAMUSCULAR | 0 refills | Status: DC

## 2018-04-03 MED ORDER — LISINOPRIL 10 MG PO TABS: ORAL_TABLET | ORAL | 1 refills | Status: DC

## 2018-04-03 MED ORDER — PSYLLIUM 30.9 % PO POWD: 1.0000 | Freq: Every day | ORAL | 2 refills | Status: DC

## 2018-04-03 MED ORDER — CELECOXIB 200 MG PO CAPS: 200.0000 mg | ORAL_CAPSULE | Freq: Every day | ORAL | 1 refills | Status: DC

## 2018-04-03 NOTE — Progress Notes (Signed)
Subjective:  Patient ID: Thomas Maldonado, male    DOB: 10-11-1933  Age: 84 y.o. MRN: [id number]  CC: Medical Management of Chronic Issues   HPI Thomas Maldonado presents for follow-up of diabetes. Patient checks his blood sugar at home frequently.  He says the readings are stable, they have not changed.  No log returned no exact numbers given other than it was about 106 today.  Patient denies symptoms such as polyuria, polydipsia, excessive hunger, nausea No significant hypoglycemic spells noted. Medications as noted below. Taking them regularly without complication/adverse reaction being reported today.  He is seeing Dr. at [facility] of neurology and had some shots in his neck.  He was also given a nausea medicine that has helped with his dizziness as well called ondansetron.  He is planning for a follow-up with Dr. Berdine Addison at the end of this week, 4 days from now for repeat of the shots in his neck.  Depression screen [facility] 2/9 04/03/2018 02/20/2018 01/09/2018  Decreased Interest 0 2 0  Down, Depressed, Hopeless 3 3 1   PHQ - 2 Score 3 5 1   Altered sleeping 0 0 -  Tired, decreased energy 3 3 -  Change in appetite 0 0 -  Feeling bad or failure about yourself  0 0 -  Trouble concentrating 0 0 -  Moving slowly or fidgety/restless 0 0 -  Suicidal thoughts 0 0 -  PHQ-9 Score 6 8 -    History Thomas Maldonado has a past medical history of Abdominal pain, Acute upper respiratory infections of unspecified site, Chronic kidney disease, stage III (moderate) ([place]), Coronary atherosclerosis of unspecified type of vessel, native or graft (1999), Diabetes mellitus without complication ([place]), Dizziness and giddiness (2007), Esophageal reflux, Headache(784.0), Heart disease, unspecified, History of colonoscopy (02/2011), Hypertension, Myocardial infarction ([place]), Nausea, Other chronic pain, Problems with hearing, Problems with sight, Pure hypercholesterolemia, Type II or unspecified type diabetes mellitus with neurological  manifestations, not stated as uncontrolled(250.60), and Type II or unspecified type diabetes mellitus with renal manifestations, not stated as uncontrolled(250.40).   He has a past surgical history that includes Neck surgery (2008); Shoulder surgery (2000); Knee arthroscopy (Right, 2000); Transurethral prostatectomy with gyrus instruments (1999); Foot surgery (Left, 1982); Hernia repair; Colon surgery; and Pacemaker insertion (2014).   His family history includes Cancer in his brother, brother, brother, brother, brother, brother, sister, sister, sister, and sister; Diabetes in his brother, brother, brother, father, sister, and sister; Heart disease in his brother, brother, brother, brother, brother, brother, sister, sister, sister, and sister.He reports that he quit smoking about 36 years ago. His smoking use included cigarettes. He has never used smokeless tobacco. He reports that he does not drink alcohol or use drugs.    ROS Review of Systems  Constitutional: Negative.   HENT: Negative.   Eyes: Negative for visual disturbance.  Respiratory: Negative for cough and shortness of breath.   Cardiovascular: Negative for chest pain and leg swelling.  Gastrointestinal: Positive for abdominal pain (Left lower quadrant.  Chronic for about a year intermittently.  Increasing over the last few weeks.) and constipation. Negative for abdominal distention, diarrhea, nausea and vomiting.  Genitourinary: Negative for difficulty urinating.  Musculoskeletal: Negative for arthralgias and myalgias.  Skin: Negative for rash.  Neurological: Negative for headaches.  Psychiatric/Behavioral: Negative for sleep disturbance.    Objective:  BP 118/71   Pulse 70   Temp (!) 97.1 F (36.2 C) (Oral)   Ht 6' (1.829 m)   Wt 220 lb 3.2 oz (99.9  kg)   BMI 29.86 kg/m   BP Readings from Last 3 Encounters:  04/03/18 118/71  02/20/18 116/73  01/09/18 108/67    Wt Readings from Last 3 Encounters:  04/03/18 220 lb  3.2 oz (99.9 kg)  02/20/18 216 lb (98 kg)  01/09/18 216 lb 2 oz (98 kg)     Physical Exam  Constitutional: He is oriented to person, place, and time. He appears well-developed and well-nourished. No distress.  HENT:  Head: Normocephalic and atraumatic.  Right Ear: External ear normal.  Left Ear: External ear normal.  Nose: Nose normal.  Mouth/Throat: Oropharynx is clear and moist.  Eyes: Pupils are equal, round, and reactive to light. Conjunctivae and EOM are normal.  Neck: Normal range of motion. Neck supple.  Cardiovascular: Normal rate, regular rhythm and normal heart sounds.  No murmur heard. Pulmonary/Chest: Effort normal and breath sounds normal. No respiratory distress. He has no wheezes. He has no rales.  Abdominal: Soft. There is tenderness (Mild tenderness at the left upper quadrant at the costal margin for palpation of the rib edge).  Musculoskeletal: Normal range of motion.  Neurological: He is alert and oriented to person, place, and time. He has normal reflexes.  Skin: Skin is warm and dry.  Psychiatric: He has a normal mood and affect. His behavior is normal. Judgment and thought content normal.      Assessment & Plan:   Thomas Maldonado was seen today for medical management of chronic issues.  Diagnoses and all orders for this visit:  Diabetic neuropathy with neurologic complication ([place]) -     Bayer DCA Hb A1c Waived  Coronary artery disease of native heart with stable angina pectoris, unspecified vessel or lesion type ([place]) -     CMP14+EGFR  Encounter for immunization -     Flu vaccine HIGH DOSE PF  Rib pain on left side  Other orders -     Discontinue: Psyllium (METAMUCIL) 30.9 % POWD; Take 1 Scoop by mouth daily. -     celecoxib (CELEBREX) 200 MG capsule; Take 1 capsule (200 mg total) by mouth daily. -     ezetimibe (ZETIA) 10 MG tablet; Take 1 tablet (10 mg total) by mouth daily. -     glipiZIDE (GLUCOTROL) 10 MG tablet; Take 1 tablet (10 mg total) by mouth  2 (two) times daily before a meal. -     hydrochlorothiazide (HYDRODIURIL) 25 MG tablet; Take 1 tablet (25 mg total) by mouth daily. -     insulin detemir (LEVEMIR) 100 UNIT/ML injection; Inject 0.5 mLs (50 Units total) into the skin every morning. -     insulin regular (HUMULIN R) 100 units/mL injection; INJECT 6 TO 10 UNITS UNDER THE SKIN TWICE DAILY, BEFORE MEALS -     lisinopril (PRINIVIL,ZESTRIL) 10 MG tablet; Take 1 tablet QAM and 1/2 tablet QPM -     omeprazole (PRILOSEC) 20 MG capsule; Take 2 capsules (40 mg total) by mouth daily. -     pravastatin (PRAVACHOL) 20 MG tablet; Take 1 tablet (20 mg total) by mouth daily. -     Psyllium (METAMUCIL) 30.9 % POWD; Take 1 Scoop by mouth daily. -     Vitamin D, Ergocalciferol, (DRISDOL) 1.25 MG (50000 UT) CAPS capsule; Take 1 capsule (50,000 Units total) by mouth every 7 (seven) days.       I have changed Thomas Maldonado's Vitamin D (Ergocalciferol). I am also having him maintain his aspirin, ondansetron, oxyCODONE-acetaminophen, oxyCODONE-acetaminophen, Cyanocobalamin, celecoxib, ezetimibe, glipiZIDE, hydrochlorothiazide, insulin  detemir, insulin regular, lisinopril, omeprazole, pravastatin, and Psyllium.  Allergies as of 04/03/2018      Reactions   Metformin And Related Nausea Only   nausea nausea nausea nausea   Prednisone Anxiety   Pt reports "with all steriods gets anxious, tremors, and tears me all to pieces" Pt reports "with all steriods gets anxious, tremors, and tears me all to pieces" Pt reports "with all steriods gets anxious, tremors, and tears me all to pieces"      Medication List        Accurate as of 04/03/18 11:57 AM. Always use your most recent med list.          aspirin 81 MG tablet Take 81 mg by mouth daily.   B-12 COMPLIANCE INJECTION 1000 MCG/ML Kit Generic drug:  Cyanocobalamin Inject as directed.   celecoxib 200 MG capsule Commonly known as:  CELEBREX Take 1 capsule (200 mg total) by mouth daily.     ezetimibe 10 MG tablet Commonly known as:  ZETIA Take 1 tablet (10 mg total) by mouth daily.   glipiZIDE 10 MG tablet Commonly known as:  GLUCOTROL Take 1 tablet (10 mg total) by mouth 2 (two) times daily before a meal.   hydrochlorothiazide 25 MG tablet Commonly known as:  HYDRODIURIL Take 1 tablet (25 mg total) by mouth daily.   insulin detemir 100 UNIT/ML injection Commonly known as:  LEVEMIR Inject 0.5 mLs (50 Units total) into the skin every morning.   insulin regular 100 units/mL injection Commonly known as:  NOVOLIN R,HUMULIN R INJECT 6 TO 10 UNITS UNDER THE SKIN TWICE DAILY, BEFORE MEALS   lisinopril 10 MG tablet Commonly known as:  PRINIVIL,ZESTRIL Take 1 tablet QAM and 1/2 tablet QPM   omeprazole 20 MG capsule Commonly known as:  PRILOSEC Take 2 capsules (40 mg total) by mouth daily.   ondansetron 4 MG tablet Commonly known as:  ZOFRAN TAKE 1 TABLET BY MOUTH EVERY 8 HOURS AS NEEDED FOR NAUSEA   oxyCODONE-acetaminophen 5-325 MG tablet Commonly known as:  PERCOCET/ROXICET Take by mouth.   oxyCODONE-acetaminophen 10-325 MG tablet Commonly known as:  PERCOCET TK 1 T PO Q 6 H PRN P   pravastatin 20 MG tablet Commonly known as:  PRAVACHOL Take 1 tablet (20 mg total) by mouth daily.   Psyllium 30.9 % Powd Take 1 Scoop by mouth daily.   Vitamin D (Ergocalciferol) 1.25 MG (50000 UT) Caps capsule Commonly known as:  DRISDOL Take 1 capsule (50,000 Units total) by mouth every 7 (seven) days.        Follow-up: No follow-ups on file.  Claretta Fraise, M.D.

## 2018-04-04 DIAGNOSIS — R42 Dizziness and giddiness: Secondary | ICD-10-CM | POA: Diagnosis not present

## 2018-04-04 DIAGNOSIS — E119 Type 2 diabetes mellitus without complications: Secondary | ICD-10-CM | POA: Diagnosis not present

## 2018-04-04 DIAGNOSIS — E785 Hyperlipidemia, unspecified: Secondary | ICD-10-CM | POA: Diagnosis not present

## 2018-04-04 DIAGNOSIS — I10 Essential (primary) hypertension: Secondary | ICD-10-CM | POA: Diagnosis not present

## 2018-04-04 DIAGNOSIS — I2510 Atherosclerotic heart disease of native coronary artery without angina pectoris: Secondary | ICD-10-CM | POA: Diagnosis not present

## 2018-04-04 LAB — CMP14+EGFR
ALBUMIN: 4 g/dL (ref 3.5–4.7)
ALT: 37 IU/L (ref 0–44)
AST: 24 IU/L (ref 0–40)
Albumin/Globulin Ratio: 1.5 (ref 1.2–2.2)
Alkaline Phosphatase: 95 IU/L (ref 39–117)
BUN/Creatinine Ratio: 19 (ref 10–24)
BUN: 27 mg/dL (ref 8–27)
Bilirubin Total: 0.2 mg/dL (ref 0.0–1.2)
CALCIUM: 9 mg/dL (ref 8.6–10.2)
CO2: 25 mmol/L (ref 20–29)
Chloride: 107 mmol/L — ABNORMAL HIGH (ref 96–106)
Creatinine, Ser: 1.45 mg/dL — ABNORMAL HIGH (ref 0.76–1.27)
GFR calc Af Amer: 51 mL/min/{1.73_m2} — ABNORMAL LOW (ref >59)
GFR, EST NON AFRICAN AMERICAN: 45 mL/min/{1.73_m2} — AB (ref >59)
GLOBULIN, TOTAL: 2.7 g/dL (ref 1.5–4.5)
GLUCOSE: 143 mg/dL — AB (ref 65–99)
Potassium: 5.1 mmol/L (ref 3.5–5.2)
SODIUM: 145 mmol/L — AB (ref 134–144)
Total Protein: 6.7 g/dL (ref 6.0–8.5)

## 2018-04-04 NOTE — Progress Notes (Signed)
Hello [place],  Your lab result is stable. Some minor variations that are not significant are commonly marked abnormal, but do not represent any medical problem for you.  Best regards, Claretta Fraise, M.D.

## 2018-04-05 ENCOUNTER — Encounter: Payer: Self-pay | Admitting: Family Medicine

## 2018-04-05 DIAGNOSIS — I2510 Atherosclerotic heart disease of native coronary artery without angina pectoris: Secondary | ICD-10-CM | POA: Diagnosis not present

## 2018-04-05 DIAGNOSIS — R42 Dizziness and giddiness: Secondary | ICD-10-CM | POA: Diagnosis not present

## 2018-04-05 DIAGNOSIS — E119 Type 2 diabetes mellitus without complications: Secondary | ICD-10-CM | POA: Diagnosis not present

## 2018-04-05 DIAGNOSIS — I10 Essential (primary) hypertension: Secondary | ICD-10-CM | POA: Diagnosis not present

## 2018-04-05 DIAGNOSIS — R110 Nausea: Secondary | ICD-10-CM | POA: Diagnosis not present

## 2018-04-10 ENCOUNTER — Ambulatory Visit (INDEPENDENT_AMBULATORY_CARE_PROVIDER_SITE_OTHER): Payer: Medicare Other | Admitting: Family Medicine

## 2018-04-10 ENCOUNTER — Encounter: Payer: Self-pay | Admitting: Family Medicine

## 2018-04-10 VITALS — BP 124/77 | HR 100 | Temp 98.2°F | Ht 72.0 in | Wt 219.0 lb

## 2018-04-10 DIAGNOSIS — Z09 Encounter for follow-up examination after completed treatment for conditions other than malignant neoplasm: Secondary | ICD-10-CM

## 2018-04-10 DIAGNOSIS — R42 Dizziness and giddiness: Secondary | ICD-10-CM

## 2018-04-10 LAB — CMP14+EGFR
ALBUMIN: 4.2 g/dL (ref 3.5–4.7)
ALT: 43 IU/L (ref 0–44)
AST: 38 IU/L (ref 0–40)
Albumin/Globulin Ratio: 1.7 (ref 1.2–2.2)
Alkaline Phosphatase: 99 IU/L (ref 39–117)
BUN/Creatinine Ratio: 12 (ref 10–24)
BUN: 16 mg/dL (ref 8–27)
Bilirubin Total: 0.3 mg/dL (ref 0.0–1.2)
CALCIUM: 9.4 mg/dL (ref 8.6–10.2)
CO2: 25 mmol/L (ref 20–29)
CREATININE: 1.34 mg/dL — AB (ref 0.76–1.27)
Chloride: 103 mmol/L (ref 96–106)
GFR, EST AFRICAN AMERICAN: 57 mL/min/{1.73_m2} — AB (ref >59)
GFR, EST NON AFRICAN AMERICAN: 49 mL/min/{1.73_m2} — AB (ref >59)
GLOBULIN, TOTAL: 2.5 g/dL (ref 1.5–4.5)
Glucose: 153 mg/dL — ABNORMAL HIGH (ref 65–99)
Potassium: 4.9 mmol/L (ref 3.5–5.2)
Sodium: 144 mmol/L (ref 134–144)
Total Protein: 6.7 g/dL (ref 6.0–8.5)

## 2018-04-10 LAB — CBC WITH DIFFERENTIAL/PLATELET
Basophils Absolute: 0.1 10*3/uL (ref 0.0–0.2)
Basos: 1 %
EOS (ABSOLUTE): 1.7 10*3/uL — ABNORMAL HIGH (ref 0.0–0.4)
EOS: 17 %
HEMATOCRIT: 41.2 % (ref 37.5–51.0)
HEMOGLOBIN: 13.5 g/dL (ref 13.0–17.7)
IMMATURE GRANULOCYTES: 0 %
Immature Grans (Abs): 0 10*3/uL (ref 0.0–0.1)
Lymphocytes Absolute: 2.5 10*3/uL (ref 0.7–3.1)
Lymphs: 26 %
MCH: 31 pg (ref 26.6–33.0)
MCHC: 32.8 g/dL (ref 31.5–35.7)
MCV: 95 fL (ref 79–97)
MONOCYTES: 7 %
Monocytes Absolute: 0.7 10*3/uL (ref 0.1–0.9)
NEUTROS PCT: 49 %
Neutrophils Absolute: 4.7 10*3/uL (ref 1.4–7.0)
Platelets: 164 10*3/uL (ref 150–450)
RBC: 4.36 x10E6/uL (ref 4.14–5.80)
RDW: 13 % (ref 12.3–15.4)
WBC: 9.7 10*3/uL (ref 3.4–10.8)

## 2018-04-10 NOTE — Progress Notes (Addendum)
Subjective:    Patient ID: Thomas Maldonado, male    DOB: 25-Mar-1934, 84 y.o.   MRN: [id number]  Chief Complaint:  Hospitalization Follow-up   HPI: Thomas Maldonado is a 84 y.o. male presenting on 04/10/2018 for Hospitalization Follow-up  Pt presents today for follow up after hospitalization. Pt was seen at [facility] on 04/03/18 for dizziness and presyncope. Pt was admitted for dizziness and presyncope. During his hospital stay he had a negative cardiac workup and a head CT with no acute changes. His lab work revealed a BUN of 28, creatinine of 1.40, GFR of 49, glucose of 223, alk phos of 99, and platelet count of 85. Pts blood pressure was noted to be soft during his hospital stay with lowest documented recording of 117/68 after IV hydration. Pt was told to stop is Percocet, Celebrex, Lisinopril, and HCTZ. Pt was discharged on 04/05/18. Pt states he has not developed any new symptoms since discharge from the hospital. States he stopped his Celebrex, Lisinopril, and HCTZ. States he did not stop his Percocet. States he still has intermittent dizziness with certain movements and positional changes, denies changes from baseline. States he has intermittent headaches. States he does have nausea at times. He denies confusion, unilateral weakness, focal neurological deficits, chest pain, abdominal pain, or syncope.   Relevant past medical, surgical, family, and social history reviewed and updated as indicated.  Allergies and medications reviewed and updated.   Past Medical History:  Diagnosis Date  . Abdominal pain   . Acute upper respiratory infections of unspecified site   . Chronic kidney disease, stage III (moderate) (HCC)   . Coronary atherosclerosis of unspecified type of vessel, native or graft 1999  . Diabetes mellitus without complication ([place])   . Dizziness and giddiness 2007  . Esophageal reflux   . Headache(784.0)   . Heart disease, unspecified   . History of colonoscopy 02/2011  .  Hypertension   . Myocardial infarction ([place])   . Nausea   . Other chronic pain   . Problems with hearing   . Problems with sight   . Pure hypercholesterolemia   . Type II or unspecified type diabetes mellitus with neurological manifestations, not stated as uncontrolled(250.60)   . Type II or unspecified type diabetes mellitus with renal manifestations, not stated as uncontrolled(250.40)     Past Surgical History:  Procedure Laterality Date  . COLON SURGERY    . FOOT SURGERY Left 1982  . HERNIA REPAIR    . KNEE ARTHROSCOPY Right 2000  . NECK SURGERY  2008   Plate in neck  . PACEMAKER INSERTION  2014  . SHOULDER SURGERY  2000  . TRANSURETHRAL PROSTATECTOMY WITH GYRUS INSTRUMENTS  1999    Social History   Socioeconomic History  . Marital status: Divorced    Spouse name: Not on file  . Number of children: Not on file  . Years of education: 6  . Highest education level: Not on file  Occupational History  . Not on file  Social Needs  . Financial resource strain: Not on file  . Food insecurity:    Worry: Not on file    Inability: Not on file  . Transportation needs:    Medical: Not on file    Non-medical: Not on file  Tobacco Use  . Smoking status: Former Smoker    Types: Cigarettes    Last attempt to quit: 11/02/1981    Years since quitting: 36.4  . Smokeless tobacco: Never Used  Substance and Sexual Activity  . Alcohol use: No  . Drug use: No  . Sexual activity: Not Currently  Lifestyle  . Physical activity:    Days per week: Not on file    Minutes per session: Not on file  . Stress: Not on file  Relationships  . Social connections:    Talks on phone: Not on file    Gets together: Not on file    Attends religious service: Not on file    Active member of club or organization: Not on file    Attends meetings of clubs or organizations: Not on file    Relationship status: Not on file  . Intimate partner violence:    Fear of current or ex partner: Not on file     Emotionally abused: Not on file    Physically abused: Not on file    Forced sexual activity: Not on file  Other Topics Concern  . Not on file  Social History Narrative  . Not on file    Outpatient Encounter Medications as of 04/10/2018  Medication Sig  . aspirin 81 MG tablet Take 81 mg by mouth daily.  . Cyanocobalamin (B-12 COMPLIANCE INJECTION) 1000 MCG/ML KIT Inject as directed.  . ezetimibe (ZETIA) 10 MG tablet Take 1 tablet (10 mg total) by mouth daily.  Marland Kitchen glipiZIDE (GLUCOTROL) 10 MG tablet Take 1 tablet (10 mg total) by mouth 2 (two) times daily before a meal.  . insulin detemir (LEVEMIR) 100 UNIT/ML injection Inject 0.5 mLs (50 Units total) into the skin every morning.  . insulin regular (HUMULIN R) 100 units/mL injection INJECT 6 TO 10 UNITS UNDER THE SKIN TWICE DAILY, BEFORE MEALS  . omeprazole (PRILOSEC) 20 MG capsule Take 2 capsules (40 mg total) by mouth daily.  . ondansetron (ZOFRAN) 4 MG tablet TAKE 1 TABLET BY MOUTH EVERY 8 HOURS AS NEEDED FOR NAUSEA  . oxyCODONE-acetaminophen (PERCOCET) 10-325 MG tablet TK 1 T PO Q 6 H PRN P  . oxyCODONE-acetaminophen (PERCOCET/ROXICET) 5-325 MG tablet Take by mouth.  . pravastatin (PRAVACHOL) 20 MG tablet Take 1 tablet (20 mg total) by mouth daily.  . Psyllium (METAMUCIL) 30.9 % POWD Take 1 Scoop by mouth daily.  . Vitamin D, Ergocalciferol, (DRISDOL) 1.25 MG (50000 UT) CAPS capsule Take 1 capsule (50,000 Units total) by mouth every 7 (seven) days.  . celecoxib (CELEBREX) 200 MG capsule Take 1 capsule (200 mg total) by mouth daily. (Patient not taking: Reported on 04/10/2018)  . hydrochlorothiazide (HYDRODIURIL) 25 MG tablet Take 1 tablet (25 mg total) by mouth daily. (Patient not taking: Reported on 04/10/2018)  . lisinopril (PRINIVIL,ZESTRIL) 10 MG tablet Take 1 tablet QAM and 1/2 tablet QPM (Patient not taking: Reported on 04/10/2018)   No facility-administered encounter medications on file as of 04/10/2018.     Allergies    Allergen Reactions  . Metformin And Related Nausea Only    nausea nausea nausea nausea   . Prednisone Anxiety    Pt reports "with all steriods gets anxious, tremors, and tears me all to pieces" Pt reports "with all steriods gets anxious, tremors, and tears me all to pieces" Pt reports "with all steriods gets anxious, tremors, and tears me all to pieces"     Review of Systems  Constitutional: Negative for chills, fatigue and fever.  Respiratory: Negative for cough, chest tightness and shortness of breath.   Cardiovascular: Negative for chest pain, palpitations and leg swelling.  Gastrointestinal: Positive for nausea. Negative for abdominal pain, blood  in stool, constipation, diarrhea and vomiting.  Genitourinary: Negative for decreased urine volume and difficulty urinating.  Musculoskeletal: Positive for arthralgias (chronic).  Skin: Negative for color change and pallor.  Neurological: Positive for dizziness and headaches. Negative for tremors, seizures, syncope, facial asymmetry, speech difficulty, weakness and numbness.  Psychiatric/Behavioral: Negative for confusion.  All other systems reviewed and are negative.       Objective:    BP 124/77 (BP Location: Left Arm, Patient Position: Standing, Cuff Size: Large)   Pulse 100   Temp 98.2 F (36.8 C)   Ht 6' (1.829 m)   Wt 219 lb (99.3 kg)   BMI 29.70 kg/m    Wt Readings from Last 3 Encounters:  04/10/18 219 lb (99.3 kg)  04/03/18 220 lb 3.2 oz (99.9 kg)  02/20/18 216 lb (98 kg)    Physical Exam  Constitutional: He is oriented to person, place, and time. He appears well-developed and well-nourished. He is cooperative. No distress.  HENT:  Head: Normocephalic and atraumatic.  Right Ear: Tympanic membrane, external ear and ear canal normal. Decreased hearing is noted.  Left Ear: Tympanic membrane, external ear and ear canal normal. Decreased hearing is noted.  Nose: Nose normal.  Mouth/Throat: Uvula is midline,  oropharynx is clear and moist and mucous membranes are normal.  Eyes: Pupils are equal, round, and reactive to light. Conjunctivae, EOM and lids are normal. Right eye exhibits no nystagmus. Left eye exhibits no nystagmus.  Neck: Trachea normal, full passive range of motion without pain and phonation normal. Neck supple. No JVD present. Carotid bruit is not present.  Cardiovascular: Normal rate, regular rhythm, normal heart sounds and intact distal pulses.  Pulmonary/Chest: Effort normal and breath sounds normal. No respiratory distress.  Abdominal: Soft. Normal appearance and bowel sounds are normal. There is no tenderness.  Neurological: He is alert and oriented to person, place, and time. He has normal strength. No cranial nerve deficit or sensory deficit.  Skin: Skin is warm and dry. Capillary refill takes less than 2 seconds.  Psychiatric: He has a normal mood and affect. His speech is normal and behavior is normal. Judgment and thought content normal. Cognition and memory are normal.  Nursing note and vitals reviewed.     Pertinent labs & imaging results that were available during my care of the patient were reviewed by me and considered in my medical decision making. Records from [facility] were reviewed and considered in my medical decision making.  Assessment & Plan:  Tarrell was seen today for hospitalization follow-up.  Diagnoses and all orders for this visit:  Hospital discharge follow-up Pt was admitted at [facility] from 04/03/18 - 04/05/18 for dizziness and presyncope. His BUN and creatinine was slightly elevated at 28 and 1.40 and his blood pressure was soft during his hospital admission. Will hold Lisinopril and HCTZ, pt will keep a log of BP and return in 2-4 weeks for reevaluation.  -     CBC with Differential/Platelet -     CMP14+EGFR  Dizziness No focal neurological deficits. Negative head CT and negative cardiac workup at [facility]. No new symptoms since  discharge from hospital. Slow positional changes. Safety precautions with dizziness discussed.   Can restart Celebrex. Hold lisinopril and HCTZ. Keep a log of blood pressure and bring to follow up appointment. Report any drastic increases or decreases in blood pressure.   Continue all other maintenance medications.  Follow up plan: Return in about 4 weeks (around 05/08/2018), or if symptoms  worsen or fail to improve.  Educational handout given for Dizziness  The above assessment and management plan was discussed with the patient. The patient verbalized understanding of and has agreed to the management plan. Patient is aware to call the clinic if symptoms persist or worsen. Patient is aware when to return to the clinic for a follow-up visit. Patient educated on when it is appropriate to go to the emergency department.   Monia Pouch, FNP-C [facility] [phone]

## 2018-04-10 NOTE — Patient Instructions (Addendum)
May restart Celebrex. Hold lisinopril and HCTZ. Keep a log of your blood pressure and bring it to your next visit with Dr. Livia Snellen.   Dizziness Dizziness is a common problem. It makes you feel unsteady or light-headed. You may feel like you are about to pass out (faint). Dizziness can lead to getting hurt if you stumble or fall. Dizziness can be caused by many things, including:  Medicines.  Not having enough water in your body (dehydration).  Illness.  Follow these instructions at home: Eating and drinking  Drink enough fluid to keep your pee (urine) clear or pale yellow. This helps to keep you from getting dehydrated. Try to drink more clear fluids, such as water.  Do not drink alcohol.  Limit how much caffeine you drink or eat, if your doctor tells you to do that.  Limit how much salt (sodium) you drink or eat, if your doctor tells you to do that. Activity  Avoid making quick movements. ? When you stand up from sitting in a chair, steady yourself until you feel okay. ? In the morning, first sit up on the side of the bed. When you feel okay, stand slowly while you hold onto something. Do this until you know that your balance is fine.  If you need to stand in one place for a long time, move your legs often. Tighten and relax the muscles in your legs while you are standing.  Do not drive or use heavy machinery if you feel dizzy.  Avoid bending down if you feel dizzy. Place items in your home so you can reach them easily without leaning over. Lifestyle  Do not use any products that contain nicotine or tobacco, such as cigarettes and e-cigarettes. If you need help quitting, ask your doctor.  Try to lower your stress level. You can do this by using methods such as yoga or meditation. Talk with your doctor if you need help. General instructions  Watch your dizziness for any changes.  Take over-the-counter and prescription medicines only as told by your doctor. Talk with your  doctor if you think that you are dizzy because of a medicine that you are taking.  Tell a friend or a family member that you are feeling dizzy. If he or she notices any changes in your behavior, have this person call your doctor.  Keep all follow-up visits as told by your doctor. This is important. Contact a doctor if:  Your dizziness does not go away.  Your dizziness or light-headedness gets worse.  You feel sick to your stomach (nauseous).  You have trouble hearing.  You have new symptoms.  You are unsteady on your feet.  You feel like the room is spinning. Get help right away if:  You throw up (vomit) or have watery poop (diarrhea), and you cannot eat or drink anything.  You have trouble: ? Talking. ? Walking. ? Swallowing. ? Using your arms, hands, or legs.  You feel generally weak.  You are not thinking clearly, or you have trouble forming sentences. A friend or family member may notice this.  You have: ? Chest pain. ? Pain in your belly (abdomen). ? Shortness of breath. ? Sweating.  Your vision changes.  You are bleeding.  You have a very bad headache.  You have neck pain or a stiff neck.  You have a fever. These symptoms may be an emergency. Do not wait to see if the symptoms will go away. Get medical help right away. Call  your local emergency services (911 in the [place] Do not drive yourself to the hospital. Summary  Dizziness makes you feel unsteady or light-headed. You may feel like you are about to pass out (faint).  Drink enough fluid to keep your pee (urine) clear or pale yellow. Do not drink alcohol.  Avoid making quick movements if you feel dizzy.  Watch your dizziness for any changes. This information is not intended to replace advice given to you by your health care provider. Make sure you discuss any questions you have with your health care provider. Document Released: 04/29/2011 Document Revised: 05/27/2016 Document Reviewed:  05/27/2016 Elsevier Interactive Patient Education  2017 [employer].

## 2018-04-17 DIAGNOSIS — I441 Atrioventricular block, second degree: Secondary | ICD-10-CM | POA: Diagnosis not present

## 2018-05-03 ENCOUNTER — Telehealth: Payer: Self-pay | Admitting: Family Medicine

## 2018-05-03 ENCOUNTER — Other Ambulatory Visit: Payer: Self-pay | Admitting: Family Medicine

## 2018-05-03 NOTE — Telephone Encounter (Signed)
PT daughter has called states that his diabetic meter has broke and is in need of another, she doesn't know the name of it. If we have one as a sample in the back she would like to have that one for him, if not please send to pharmacy, please call and let betty know either way.    Pharmacy-[facility] [place]

## 2018-05-05 ENCOUNTER — Other Ambulatory Visit: Payer: Self-pay | Admitting: Family Medicine

## 2018-05-05 ENCOUNTER — Other Ambulatory Visit: Payer: Self-pay | Admitting: *Deleted

## 2018-05-05 MED ORDER — ACCU-CHEK GUIDE W/DEVICE KIT: 1.0000 | PACK | Freq: Two times a day (BID) | 0 refills | Status: DC

## 2018-05-05 MED ORDER — GLUCOSE BLOOD VI STRP: ORAL_STRIP | 12 refills | Status: DC

## 2018-05-05 MED ORDER — ACCU-CHEK SOFT TOUCH LANCETS MISC: 12 refills | Status: DC

## 2018-05-05 NOTE — Telephone Encounter (Signed)
Okay to do monitor scrip. I don't think it will go out over computer. Have her come by and pick it up. Thanks, WS

## 2018-05-05 NOTE — Telephone Encounter (Signed)
rx sent over to pharmacy and pt is aware.

## 2018-05-05 NOTE — Telephone Encounter (Signed)
Rx sent to pharmacy   

## 2018-05-08 ENCOUNTER — Ambulatory Visit (INDEPENDENT_AMBULATORY_CARE_PROVIDER_SITE_OTHER): Payer: Medicare Other | Admitting: Family Medicine

## 2018-05-08 ENCOUNTER — Encounter: Payer: Self-pay | Admitting: Family Medicine

## 2018-05-08 VITALS — BP 133/83 | HR 94 | Temp 97.4°F | Ht 72.0 in | Wt 224.4 lb

## 2018-05-08 DIAGNOSIS — I10 Essential (primary) hypertension: Secondary | ICD-10-CM | POA: Diagnosis not present

## 2018-05-08 NOTE — Progress Notes (Signed)
Subjective:  Patient ID: Thomas Maldonado, male    DOB: March 18, 1934  Age: 84 y.o. MRN: [id number]  CC: Blood Pressure Check (4 week)   HPI Thomas Maldonado presents for  follow-up of hypertension. Patient has no history of headache chest pain or shortness of breath or recent cough. Patient also denies symptoms of TIA such as focal numbness or weakness.  Patient is checking blood pressure daily at home.  He brings in multiple readings taken over the last month.  He tells me that although they are dated from the first through 31 December they actually are not all from December there and chronological order but the day itself is incorrect.  The log is reviewed and attached.  His systolic readings run from a low of 123 as high as 154.  Most seem to be in the 1 35-1 45 range.  His diastolic readings are universally in range running anywhere from mid 60s to mid 80s but most remain in the low 70s.  That log is reviewed with the patient and sent for scanning to be attached.  Patient denies side effects from medication. States taking it regularly.  During his recent hospitalization the patient was taken off of his lisinopril   History Thomas Maldonado has a past medical history of Abdominal pain, Acute upper respiratory infections of unspecified site, Chronic kidney disease, stage III (moderate) ([place]), Coronary atherosclerosis of unspecified type of vessel, native or graft (1999), Diabetes mellitus without complication ([place]), Dizziness and giddiness (2007), Esophageal reflux, Headache(784.0), Heart disease, unspecified, History of colonoscopy (02/2011), Hypertension, Myocardial infarction ([place]), Nausea, Other chronic pain, Problems with hearing, Problems with sight, Pure hypercholesterolemia, Type II or unspecified type diabetes mellitus with neurological manifestations, not stated as uncontrolled(250.60), and Type II or unspecified type diabetes mellitus with renal manifestations, not stated as uncontrolled(250.40).   He has a  past surgical history that includes Neck surgery (2008); Shoulder surgery (2000); Knee arthroscopy (Right, 2000); Transurethral prostatectomy with gyrus instruments (1999); Foot surgery (Left, 1982); Hernia repair; Colon surgery; and Pacemaker insertion (2014).   His family history includes Cancer in his brother, brother, brother, brother, brother, brother, sister, sister, sister, and sister; Diabetes in his brother, brother, brother, father, sister, and sister; Heart disease in his brother, brother, brother, brother, brother, brother, sister, sister, sister, and sister.He reports that he quit smoking about 36 years ago. His smoking use included cigarettes. He has never used smokeless tobacco. He reports that he does not drink alcohol or use drugs.  Current Outpatient Medications on File Prior to Visit  Medication Sig Dispense Refill  . aspirin 81 MG tablet Take 81 mg by mouth daily.    . Blood Glucose Monitoring Suppl (ACCU-CHEK GUIDE) w/Device KIT 1 each by Does not apply route 2 (two) times daily. 1 kit 0  . celecoxib (CELEBREX) 200 MG capsule Take 1 capsule (200 mg total) by mouth daily. 90 capsule 1  . Cyanocobalamin (B-12 COMPLIANCE INJECTION) 1000 MCG/ML KIT Inject as directed.    . ezetimibe (ZETIA) 10 MG tablet Take 1 tablet (10 mg total) by mouth daily. 90 tablet 1  . glipiZIDE (GLUCOTROL) 10 MG tablet Take 1 tablet (10 mg total) by mouth 2 (two) times daily before a meal. 180 tablet 1  . glucose blood test strip Use as instructed 100 each 12  . hydrochlorothiazide (HYDRODIURIL) 25 MG tablet Take 1 tablet (25 mg total) by mouth daily. 90 tablet 1  . insulin detemir (LEVEMIR) 100 UNIT/ML injection Inject 0.5 mLs (50 Units total)  into the skin every morning. 45 mL 1  . insulin regular (HUMULIN R) 100 units/mL injection INJECT SUBCUTANEOUSLY 6 TO  10 UNITS TWO TIMES A DAY  BEFORE MEALS , PER SLIDING  SCALE UP TO 50 UNITS PER  DAY MAX 30 mL 1  . Lancets (ACCU-CHEK SOFT TOUCH) lancets Use as  instructed 100 each 12  . lisinopril (PRINIVIL,ZESTRIL) 10 MG tablet Take 1 tablet QAM and 1/2 tablet QPM 135 tablet 1  . omeprazole (PRILOSEC) 20 MG capsule Take 2 capsules (40 mg total) by mouth daily. 180 capsule 1  . ondansetron (ZOFRAN) 4 MG tablet TAKE 1 TABLET BY MOUTH EVERY 8 HOURS AS NEEDED FOR NAUSEA    . oxyCODONE-acetaminophen (PERCOCET) 10-325 MG tablet TK 1 T PO Q 6 H PRN P  0  . oxyCODONE-acetaminophen (PERCOCET/ROXICET) 5-325 MG tablet Take by mouth.    . pravastatin (PRAVACHOL) 20 MG tablet Take 1 tablet (20 mg total) by mouth daily. 90 tablet 1  . Psyllium (METAMUCIL) 30.9 % POWD Take 1 Scoop by mouth daily. 1 Bottle 2  . Vitamin D, Ergocalciferol, (DRISDOL) 1.25 MG (50000 UT) CAPS capsule Take 1 capsule (50,000 Units total) by mouth every 7 (seven) days. 13 capsule 1   No current facility-administered medications on file prior to visit.     ROS Review of Systems  Constitutional: Negative.   HENT: Negative.   Eyes: Negative for visual disturbance.  Respiratory: Negative for cough and shortness of breath.   Cardiovascular: Negative for chest pain and leg swelling.  Gastrointestinal: Negative for abdominal pain, diarrhea, nausea and vomiting.  Genitourinary: Negative for difficulty urinating.  Musculoskeletal: Negative for arthralgias and myalgias.  Skin: Negative for rash.  Neurological: Negative for headaches.  Psychiatric/Behavioral: Negative for sleep disturbance.    Objective:  BP 133/83   Pulse 94   Temp (!) 97.4 F (36.3 C) (Oral)   Ht 6' (1.829 m)   Wt 224 lb 6.4 oz (101.8 kg)   BMI 30.43 kg/m   BP Readings from Last 3 Encounters:  05/08/18 133/83  04/10/18 124/77  04/03/18 118/71    Wt Readings from Last 3 Encounters:  05/08/18 224 lb 6.4 oz (101.8 kg)  04/10/18 219 lb (99.3 kg)  04/03/18 220 lb 3.2 oz (99.9 kg)     Physical Exam Vitals signs reviewed.  Constitutional:      General: He is not in acute distress.    Appearance: He is  well-developed.  HENT:     Head: Normocephalic and atraumatic.     Right Ear: External ear normal.     Left Ear: External ear normal.     Nose: Nose normal.     Mouth/Throat:     Pharynx: No oropharyngeal exudate or posterior oropharyngeal erythema.  Eyes:     Conjunctiva/sclera: Conjunctivae normal.     Pupils: Pupils are equal, round, and reactive to light.  Neck:     Musculoskeletal: Normal range of motion and neck supple.  Cardiovascular:     Rate and Rhythm: Normal rate and regular rhythm.     Heart sounds: Normal heart sounds. No murmur.  Pulmonary:     Effort: Pulmonary effort is normal. No respiratory distress.     Breath sounds: Normal breath sounds. No wheezing or rales.  Abdominal:     Palpations: Abdomen is soft.     Tenderness: There is no abdominal tenderness.  Musculoskeletal: Normal range of motion.  Skin:    General: Skin is warm and dry.  Neurological:  Mental Status: He is alert and oriented to person, place, and time.     Deep Tendon Reflexes: Reflexes are normal and symmetric.  Psychiatric:        Behavior: Behavior normal.        Thought Content: Thought content normal.        Judgment: Judgment normal.     Assessment & Plan:   Thomas Maldonado was seen today for blood pressure check.  Diagnoses and all orders for this visit:  Essential hypertension   Allergies as of 05/08/2018      Reactions   Metformin And Related Nausea Only   nausea nausea nausea nausea   Prednisone Anxiety   Pt reports "with all steriods gets anxious, tremors, and tears me all to pieces" Pt reports "with all steriods gets anxious, tremors, and tears me all to pieces" Pt reports "with all steriods gets anxious, tremors, and tears me all to pieces"      Medication List       Accurate as of May 08, 2018  3:47 PM. Always use your most recent med list.        ACCU-CHEK GUIDE w/Device Kit 1 each by Does not apply route 2 (two) times daily.   accu-chek soft touch  lancets Use as instructed   aspirin 81 MG tablet Take 81 mg by mouth daily.   B-12 COMPLIANCE INJECTION 1000 MCG/ML Kit Generic drug:  Cyanocobalamin Inject as directed.   celecoxib 200 MG capsule Commonly known as:  CELEBREX Take 1 capsule (200 mg total) by mouth daily.   ezetimibe 10 MG tablet Commonly known as:  ZETIA Take 1 tablet (10 mg total) by mouth daily.   glipiZIDE 10 MG tablet Commonly known as:  GLUCOTROL Take 1 tablet (10 mg total) by mouth 2 (two) times daily before a meal.   glucose blood test strip Use as instructed   hydrochlorothiazide 25 MG tablet Commonly known as:  HYDRODIURIL Take 1 tablet (25 mg total) by mouth daily.   insulin detemir 100 UNIT/ML injection Commonly known as:  LEVEMIR Inject 0.5 mLs (50 Units total) into the skin every morning.   insulin regular 100 units/mL injection Commonly known as:  HUMULIN R INJECT SUBCUTANEOUSLY 6 TO  10 UNITS TWO TIMES A DAY  BEFORE MEALS , PER SLIDING  SCALE UP TO 50 UNITS PER  DAY MAX   lisinopril 10 MG tablet Commonly known as:  PRINIVIL,ZESTRIL Take 1 tablet QAM and 1/2 tablet QPM   omeprazole 20 MG capsule Commonly known as:  PRILOSEC Take 2 capsules (40 mg total) by mouth daily.   ondansetron 4 MG tablet Commonly known as:  ZOFRAN TAKE 1 TABLET BY MOUTH EVERY 8 HOURS AS NEEDED FOR NAUSEA   oxyCODONE-acetaminophen 5-325 MG tablet Commonly known as:  PERCOCET/ROXICET Take by mouth.   oxyCODONE-acetaminophen 10-325 MG tablet Commonly known as:  PERCOCET TK 1 T PO Q 6 H PRN P   pravastatin 20 MG tablet Commonly known as:  PRAVACHOL Take 1 tablet (20 mg total) by mouth daily.   Psyllium 30.9 % Powd Commonly known as:  METAMUCIL Take 1 Scoop by mouth daily.   Vitamin D (Ergocalciferol) 1.25 MG (50000 UT) Caps capsule Commonly known as:  DRISDOL Take 1 capsule (50,000 Units total) by mouth every 7 (seven) days.       No orders of the defined types were placed in this  encounter.   Since the patient is a diabetic with elevated cholesterol under treatment and a history  of insulin dependence and ASCVD.,  I would like to bring his systolic down into the 790X and ideally the 120s consistently.  At this time I believe he is safe to resume his lisinopril.  However, I will be monitoring his renal function with each visit  Follow-up: Return in about 2 months (around 07/09/2018) for diabetes, hypertension.  Claretta Fraise, M.D.

## 2018-05-25 ENCOUNTER — Encounter (HOSPITAL_COMMUNITY): Payer: Self-pay | Admitting: Emergency Medicine

## 2018-05-25 ENCOUNTER — Observation Stay (HOSPITAL_COMMUNITY)
Admission: EM | Admit: 2018-05-25 | Discharge: 2018-05-26 | Disposition: A | Discharge status: Home / Self Care | Payer: Medicare Other | Attending: Internal Medicine | Admitting: Internal Medicine

## 2018-05-25 ENCOUNTER — Emergency Department (HOSPITAL_COMMUNITY): Payer: Medicare Other

## 2018-05-25 DIAGNOSIS — M47816 Spondylosis without myelopathy or radiculopathy, lumbar region: Secondary | ICD-10-CM | POA: Diagnosis not present

## 2018-05-25 DIAGNOSIS — Z888 Allergy status to other drugs, medicaments and biological substances status: Secondary | ICD-10-CM | POA: Insufficient documentation

## 2018-05-25 DIAGNOSIS — N183 Chronic kidney disease, stage 3 unspecified: Secondary | ICD-10-CM

## 2018-05-25 DIAGNOSIS — E1122 Type 2 diabetes mellitus with diabetic chronic kidney disease: Secondary | ICD-10-CM | POA: Insufficient documentation

## 2018-05-25 DIAGNOSIS — I700 Atherosclerosis of aorta: Secondary | ICD-10-CM | POA: Insufficient documentation

## 2018-05-25 DIAGNOSIS — J8410 Pulmonary fibrosis, unspecified: Secondary | ICD-10-CM | POA: Insufficient documentation

## 2018-05-25 DIAGNOSIS — I10 Essential (primary) hypertension: Secondary | ICD-10-CM | POA: Diagnosis present

## 2018-05-25 DIAGNOSIS — E785 Hyperlipidemia, unspecified: Secondary | ICD-10-CM | POA: Insufficient documentation

## 2018-05-25 DIAGNOSIS — E7800 Pure hypercholesterolemia, unspecified: Secondary | ICD-10-CM | POA: Diagnosis present

## 2018-05-25 DIAGNOSIS — I129 Hypertensive chronic kidney disease with stage 1 through stage 4 chronic kidney disease, or unspecified chronic kidney disease: Secondary | ICD-10-CM | POA: Diagnosis not present

## 2018-05-25 DIAGNOSIS — E1165 Type 2 diabetes mellitus with hyperglycemia: Secondary | ICD-10-CM | POA: Diagnosis not present

## 2018-05-25 DIAGNOSIS — Z79899 Other long term (current) drug therapy: Secondary | ICD-10-CM | POA: Insufficient documentation

## 2018-05-25 DIAGNOSIS — R0689 Other abnormalities of breathing: Secondary | ICD-10-CM | POA: Diagnosis not present

## 2018-05-25 DIAGNOSIS — R079 Chest pain, unspecified: Secondary | ICD-10-CM | POA: Diagnosis not present

## 2018-05-25 DIAGNOSIS — I2510 Atherosclerotic heart disease of native coronary artery without angina pectoris: Principal | ICD-10-CM | POA: Insufficient documentation

## 2018-05-25 DIAGNOSIS — R072 Precordial pain: Secondary | ICD-10-CM | POA: Diagnosis not present

## 2018-05-25 DIAGNOSIS — Z87891 Personal history of nicotine dependence: Secondary | ICD-10-CM | POA: Insufficient documentation

## 2018-05-25 DIAGNOSIS — I213 ST elevation (STEMI) myocardial infarction of unspecified site: Secondary | ICD-10-CM | POA: Diagnosis not present

## 2018-05-25 DIAGNOSIS — Z955 Presence of coronary angioplasty implant and graft: Secondary | ICD-10-CM | POA: Insufficient documentation

## 2018-05-25 DIAGNOSIS — Z8249 Family history of ischemic heart disease and other diseases of the circulatory system: Secondary | ICD-10-CM | POA: Diagnosis not present

## 2018-05-25 DIAGNOSIS — Z66 Do not resuscitate: Secondary | ICD-10-CM | POA: Diagnosis not present

## 2018-05-25 DIAGNOSIS — I441 Atrioventricular block, second degree: Secondary | ICD-10-CM | POA: Diagnosis not present

## 2018-05-25 DIAGNOSIS — R7989 Other specified abnormal findings of blood chemistry: Secondary | ICD-10-CM | POA: Diagnosis not present

## 2018-05-25 DIAGNOSIS — I252 Old myocardial infarction: Secondary | ICD-10-CM | POA: Insufficient documentation

## 2018-05-25 DIAGNOSIS — K219 Gastro-esophageal reflux disease without esophagitis: Secondary | ICD-10-CM | POA: Diagnosis not present

## 2018-05-25 DIAGNOSIS — M47812 Spondylosis without myelopathy or radiculopathy, cervical region: Secondary | ICD-10-CM | POA: Diagnosis not present

## 2018-05-25 DIAGNOSIS — Z794 Long term (current) use of insulin: Secondary | ICD-10-CM | POA: Insufficient documentation

## 2018-05-25 DIAGNOSIS — E1140 Type 2 diabetes mellitus with diabetic neuropathy, unspecified: Secondary | ICD-10-CM | POA: Diagnosis present

## 2018-05-25 DIAGNOSIS — M479 Spondylosis, unspecified: Secondary | ICD-10-CM | POA: Diagnosis not present

## 2018-05-25 DIAGNOSIS — R778 Other specified abnormalities of plasma proteins: Secondary | ICD-10-CM

## 2018-05-25 DIAGNOSIS — J984 Other disorders of lung: Secondary | ICD-10-CM | POA: Diagnosis not present

## 2018-05-25 DIAGNOSIS — Z833 Family history of diabetes mellitus: Secondary | ICD-10-CM | POA: Diagnosis not present

## 2018-05-25 DIAGNOSIS — Z950 Presence of cardiac pacemaker: Secondary | ICD-10-CM | POA: Diagnosis not present

## 2018-05-25 DIAGNOSIS — R0789 Other chest pain: Secondary | ICD-10-CM | POA: Diagnosis not present

## 2018-05-25 DIAGNOSIS — Z7982 Long term (current) use of aspirin: Secondary | ICD-10-CM | POA: Diagnosis not present

## 2018-05-25 DIAGNOSIS — E1149 Type 2 diabetes mellitus with other diabetic neurological complication: Secondary | ICD-10-CM

## 2018-05-25 HISTORY — DX: Ventricular tachycardia: I47.2

## 2018-05-25 HISTORY — DX: Other ventricular tachycardia: I47.29

## 2018-05-25 HISTORY — DX: Unspecified atrioventricular block: I44.30

## 2018-05-25 HISTORY — DX: Atherosclerotic heart disease of native coronary artery without angina pectoris: I25.10

## 2018-05-25 LAB — COMPREHENSIVE METABOLIC PANEL
ALT: 16 U/L (ref 0–44)
AST: 22 U/L (ref 15–41)
Albumin: 3.6 g/dL (ref 3.5–5.0)
Alkaline Phosphatase: 86 U/L (ref 38–126)
Anion gap: 8 (ref 5–15)
BUN: 18 mg/dL (ref 8–23)
CO2: 26 mmol/L (ref 22–32)
Calcium: 8.8 mg/dL — ABNORMAL LOW (ref 8.9–10.3)
Chloride: 106 mmol/L (ref 98–111)
Creatinine, Ser: 1.64 mg/dL — ABNORMAL HIGH (ref 0.61–1.24)
GFR calc Af Amer: 44 mL/min — ABNORMAL LOW (ref >60)
GFR calc non Af Amer: 38 mL/min — ABNORMAL LOW (ref >60)
Glucose, Bld: 338 mg/dL — ABNORMAL HIGH (ref 70–99)
Potassium: 4.5 mmol/L (ref 3.5–5.1)
SODIUM: 140 mmol/L (ref 135–145)
Total Bilirubin: 0.1 mg/dL — ABNORMAL LOW (ref 0.3–1.2)
Total Protein: 6.9 g/dL (ref 6.5–8.1)

## 2018-05-25 LAB — CBC WITH DIFFERENTIAL/PLATELET
Abs Immature Granulocytes: 0.02 10*3/uL (ref 0.00–0.07)
Basophils Absolute: 0.1 10*3/uL (ref 0.0–0.1)
Basophils Relative: 1 %
EOS ABS: 1.3 10*3/uL — AB (ref 0.0–0.5)
Eosinophils Relative: 15 %
HCT: 42.4 % (ref 39.0–52.0)
Hemoglobin: 13.2 g/dL (ref 13.0–17.0)
Immature Granulocytes: 0 %
LYMPHS ABS: 1.4 10*3/uL (ref 0.7–4.0)
Lymphocytes Relative: 17 %
MCH: 30 pg (ref 26.0–34.0)
MCHC: 31.1 g/dL (ref 30.0–36.0)
MCV: 96.4 fL (ref 80.0–100.0)
Monocytes Absolute: 0.6 10*3/uL (ref 0.1–1.0)
Monocytes Relative: 7 %
Neutro Abs: 5 10*3/uL (ref 1.7–7.7)
Neutrophils Relative %: 60 %
Platelets: 141 10*3/uL — ABNORMAL LOW (ref 150–400)
RBC: 4.4 MIL/uL (ref 4.22–5.81)
RDW: 12.7 % (ref 11.5–15.5)
WBC: 8.4 10*3/uL (ref 4.0–10.5)
nRBC: 0 % (ref 0.0–0.2)

## 2018-05-25 LAB — GLUCOSE, CAPILLARY
Glucose-Capillary: 194 mg/dL — ABNORMAL HIGH (ref 70–99)
Glucose-Capillary: 196 mg/dL — ABNORMAL HIGH (ref 70–99)

## 2018-05-25 LAB — URINALYSIS, ROUTINE W REFLEX MICROSCOPIC
Bacteria, UA: NONE SEEN
Bilirubin Urine: NEGATIVE
Glucose, UA: >=500 mg/dL — AB
Ketones, ur: 5 mg/dL — AB
Leukocytes, UA: NEGATIVE
Nitrite: NEGATIVE
Protein, ur: NEGATIVE mg/dL
Specific Gravity, Urine: 1.024 (ref 1.005–1.030)
pH: 5 (ref 5.0–8.0)

## 2018-05-25 LAB — CBG MONITORING, ED: Glucose-Capillary: 223 mg/dL — ABNORMAL HIGH (ref 70–99)

## 2018-05-25 LAB — LIPASE, BLOOD: Lipase: 20 U/L (ref 11–51)

## 2018-05-25 LAB — I-STAT TROPONIN, ED: Troponin i, poc: 0.02 ng/mL (ref 0.00–0.08)

## 2018-05-25 LAB — TROPONIN I: Troponin I: 0.25 ng/mL (ref <0.03)

## 2018-05-25 MED ORDER — ACETAMINOPHEN 325 MG PO TABS: 650.0000 mg | ORAL_TABLET | ORAL | Status: DC | PRN

## 2018-05-25 MED ORDER — PANTOPRAZOLE SODIUM 40 MG PO TBEC
40.0000 mg | DELAYED_RELEASE_TABLET | Freq: Every day | ORAL | Status: DC
  Administered 2018-05-26: 40 mg via ORAL
  Filled 2018-05-25: qty 1

## 2018-05-25 MED ORDER — EZETIMIBE 10 MG PO TABS
10.0000 mg | ORAL_TABLET | Freq: Every day | ORAL | Status: DC
  Administered 2018-05-26: 10 mg via ORAL
  Filled 2018-05-25: qty 1

## 2018-05-25 MED ORDER — INSULIN ASPART 100 UNIT/ML ~~LOC~~ SOLN: 0.0000 [IU] | Freq: Every day | SUBCUTANEOUS | Status: DC

## 2018-05-25 MED ORDER — INSULIN DETEMIR 100 UNIT/ML ~~LOC~~ SOLN
50.0000 [IU] | SUBCUTANEOUS | Status: DC
  Filled 2018-05-25: qty 0.5

## 2018-05-25 MED ORDER — OXYCODONE-ACETAMINOPHEN 10-325 MG PO TABS: 1.0000 | ORAL_TABLET | Freq: Four times a day (QID) | ORAL | Status: DC | PRN

## 2018-05-25 MED ORDER — PSYLLIUM 95 % PO PACK: 1.0000 | PACK | Freq: Every day | ORAL | Status: DC

## 2018-05-25 MED ORDER — ASPIRIN EC 81 MG PO TBEC
81.0000 mg | DELAYED_RELEASE_TABLET | Freq: Every day | ORAL | Status: DC
  Administered 2018-05-26: 81 mg via ORAL
  Filled 2018-05-25: qty 1

## 2018-05-25 MED ORDER — OXYCODONE-ACETAMINOPHEN 5-325 MG PO TABS: 1.0000 | ORAL_TABLET | Freq: Four times a day (QID) | ORAL | Status: DC | PRN

## 2018-05-25 MED ORDER — ONDANSETRON HCL 4 MG/2ML IJ SOLN
4.0000 mg | Freq: Four times a day (QID) | INTRAMUSCULAR | Status: DC | PRN
  Administered 2018-05-26: 4 mg via INTRAVENOUS
  Filled 2018-05-25: qty 2

## 2018-05-25 MED ORDER — OXYCODONE HCL 5 MG PO TABS: 5.0000 mg | ORAL_TABLET | Freq: Four times a day (QID) | ORAL | Status: DC | PRN

## 2018-05-25 MED ORDER — PRAVASTATIN SODIUM 10 MG PO TABS
20.0000 mg | ORAL_TABLET | Freq: Every day | ORAL | Status: DC
  Administered 2018-05-25: 20 mg via ORAL
  Filled 2018-05-25: qty 2

## 2018-05-25 MED ORDER — ENOXAPARIN SODIUM 40 MG/0.4ML ~~LOC~~ SOLN
40.0000 mg | SUBCUTANEOUS | Status: DC
  Administered 2018-05-25: 40 mg via SUBCUTANEOUS
  Filled 2018-05-25: qty 0.4

## 2018-05-25 MED ORDER — METOPROLOL TARTRATE 12.5 MG HALF TABLET
12.5000 mg | ORAL_TABLET | Freq: Two times a day (BID) | ORAL | Status: DC
  Administered 2018-05-25 – 2018-05-26 (×2): 12.5 mg via ORAL
  Filled 2018-05-25 (×2): qty 1

## 2018-05-25 MED ORDER — INSULIN ASPART 100 UNIT/ML ~~LOC~~ SOLN
0.0000 [IU] | Freq: Three times a day (TID) | SUBCUTANEOUS | Status: DC
  Administered 2018-05-26: 3 [IU] via SUBCUTANEOUS

## 2018-05-25 MED ORDER — NITROGLYCERIN 0.4 MG SL SUBL: 0.4000 mg | SUBLINGUAL_TABLET | SUBLINGUAL | Status: DC | PRN

## 2018-05-25 MED ORDER — HYDRALAZINE HCL 20 MG/ML IJ SOLN: 5.0000 mg | INTRAMUSCULAR | Status: DC | PRN

## 2018-05-25 MED ORDER — MORPHINE SULFATE (PF) 2 MG/ML IV SOLN: 2.0000 mg | INTRAVENOUS | Status: DC | PRN

## 2018-05-25 MED ORDER — OXYCODONE-ACETAMINOPHEN 5-325 MG PO TABS
1.0000 | ORAL_TABLET | Freq: Once | ORAL | Status: AC
  Administered 2018-05-25: 1 via ORAL
  Filled 2018-05-25: qty 1

## 2018-05-25 MED ORDER — OXYCODONE HCL 5 MG PO TABS
5.0000 mg | ORAL_TABLET | Freq: Once | ORAL | Status: AC
  Administered 2018-05-25: 5 mg via ORAL
  Filled 2018-05-25: qty 1

## 2018-05-25 MED ORDER — MORPHINE SULFATE (PF) 4 MG/ML IV SOLN
4.0000 mg | Freq: Once | INTRAVENOUS | Status: AC
  Administered 2018-05-25: 4 mg via INTRAVENOUS
  Filled 2018-05-25: qty 1

## 2018-05-25 NOTE — Progress Notes (Signed)
Troponin of 0.25 reported to Dr Lorin Mercy via text message.

## 2018-05-25 NOTE — ED Triage Notes (Signed)
Pt arrives via EMS from home with reports of [facility] CP that feels like a knife is sitting in his chest since this AM. EMS gave 324 ASA and 2 nitro with some relief. Hx of diabetes, pacemaker. Swelling to BLE.

## 2018-05-25 NOTE — H&P (Signed)
History and Physical    Thomas Maldonado SNK:[id number] DOB: [birth year] DOA: 05/25/2018  PCP: Claretta Fraise, MD Consultants:  Aline Brochure - pain management; [facility] - cardiology Patient coming from:  Home - lives alone; NOK: Daughter, [phone]  Chief Complaint: chest pain  HPI: Thomas Maldonado is a 85 y.o. male with medical history significant of DM; HLD; visual and hearing deficits; CAD h/o stents to LAD and circumflex; pacemaker placement due to 2nd degree heart block; and stage 3 CKD presenting with chest pain.  He developed chest pain "pretty bad" this morning and it is better now after ASA and NTG.  It was up to 10 and is down to 6 now.  Substernal pain with occasional radiation to the left arm.  Nothing makes it worse, but he has DOE.  He has not had a stress test in years and no cath since 2014.   ED Course:  H/o CAD, sees [facility] cards.  CP started today, needs CP obs.  Has ongoing pain, partial improvement with NTG.  EKG with paced rhythm.  Review of Systems: As per HPI; otherwise review of systems reviewed and negative.   Ambulatory Status:  Ambulates without assistance  Past Medical History:  Diagnosis Date  . Abdominal pain   . Acute upper respiratory infections of unspecified site   . Chronic kidney disease, stage III (moderate) (HCC)   . CKD (chronic kidney disease) stage 3, GFR 30-59 ml/min (HCC) 05/25/2018  . Coronary atherosclerosis of unspecified type of vessel, native or graft 1999  . Diabetes mellitus without complication ([place])   . Dizziness and giddiness 2007  . Esophageal reflux   . Headache(784.0)   . Heart disease, unspecified   . History of colonoscopy 02/2011  . Hypertension   . Nausea   . Other chronic pain   . Problems with hearing   . Problems with sight   . Pure hypercholesterolemia   . Type II or unspecified type diabetes mellitus with renal manifestations, not stated as uncontrolled(250.40)     Past Surgical History:  Procedure Laterality Date  .  COLON SURGERY    . FOOT SURGERY Left 1982  . HERNIA REPAIR    . KNEE ARTHROSCOPY Right 2000  . NECK SURGERY  2008   Plate in neck  . PACEMAKER INSERTION  2014  . SHOULDER SURGERY  2000  . TRANSURETHRAL PROSTATECTOMY WITH GYRUS INSTRUMENTS  1999    Social History   Socioeconomic History  . Marital status: Divorced    Spouse name: Not on file  . Number of children: Not on file  . Years of education: 6  . Highest education level: Not on file  Occupational History  . Occupation: retired  Scientific laboratory technician  . Financial resource strain: Not on file  . Food insecurity:    Worry: Not on file    Inability: Not on file  . Transportation needs:    Medical: Not on file    Non-medical: Not on file  Tobacco Use  . Smoking status: Former Smoker    Types: Cigarettes    Last attempt to quit: 11/02/1981    Years since quitting: 36.5  . Smokeless tobacco: Never Used  Substance and Sexual Activity  . Alcohol use: No  . Drug use: No  . Sexual activity: Not Currently  Lifestyle  . Physical activity:    Days per week: Not on file    Minutes per session: Not on file  . Stress: Not on file  Relationships  . Social connections:  Talks on phone: Not on file    Gets together: Not on file    Attends religious service: Not on file    Active member of club or organization: Not on file    Attends meetings of clubs or organizations: Not on file    Relationship status: Not on file  . Intimate partner violence:    Fear of current or ex partner: Not on file    Emotionally abused: Not on file    Physically abused: Not on file    Forced sexual activity: Not on file  Other Topics Concern  . Not on file  Social History Narrative  . Not on file    Allergies  Allergen Reactions  . Metformin And Related Nausea Only  . Prednisone Anxiety    Pt reports "with all steriods gets anxious, tremors, and tears me all to pieces"     Family History  Problem Relation Age of Onset  . Diabetes Father    . Cancer Brother   . Heart disease Brother   . Diabetes Brother   . Cancer Brother   . Heart disease Brother   . Diabetes Brother   . Cancer Brother   . Heart disease Brother   . Diabetes Brother   . Cancer Brother   . Heart disease Brother   . Cancer Brother   . Heart disease Brother   . Cancer Brother   . Heart disease Brother   . Cancer Sister   . Heart disease Sister   . Diabetes Sister   . Cancer Sister   . Heart disease Sister   . Diabetes Sister   . Cancer Sister   . Heart disease Sister   . Cancer Sister   . Heart disease Sister     Prior to Admission medications   Medication Sig Start Date End Date Taking? Authorizing Provider  aspirin 81 MG tablet Take 81 mg by mouth daily.    [provider]  Blood Glucose Monitoring Suppl (ACCU-CHEK GUIDE) w/Device KIT 1 each by Does not apply route 2 (two) times daily. 05/05/18   Claretta Fraise, MD  celecoxib (CELEBREX) 200 MG capsule Take 1 capsule (200 mg total) by mouth daily. 04/03/18   Claretta Fraise, MD  Cyanocobalamin (B-12 COMPLIANCE INJECTION) 1000 MCG/ML KIT Inject as directed.    [provider]  ezetimibe (ZETIA) 10 MG tablet Take 1 tablet (10 mg total) by mouth daily. 04/03/18   Claretta Fraise, MD  glipiZIDE (GLUCOTROL) 10 MG tablet Take 1 tablet (10 mg total) by mouth 2 (two) times daily before a meal. 04/03/18   Claretta Fraise, MD  glucose blood test strip Use as instructed 05/05/18   Claretta Fraise, MD  hydrochlorothiazide (HYDRODIURIL) 25 MG tablet Take 1 tablet (25 mg total) by mouth daily. 04/03/18   Claretta Fraise, MD  insulin detemir (LEVEMIR) 100 UNIT/ML injection Inject 0.5 mLs (50 Units total) into the skin every morning. 04/03/18 07/02/18  Claretta Fraise, MD  insulin regular (HUMULIN R) 100 units/mL injection INJECT SUBCUTANEOUSLY 6 TO  10 UNITS TWO TIMES A DAY  BEFORE MEALS , PER SLIDING  SCALE UP TO 50 UNITS PER  DAY MAX 05/03/18   Claretta Fraise, MD  Lancets (ACCU-CHEK SOFT TOUCH)  lancets Use as instructed 05/05/18   Claretta Fraise, MD  lisinopril (PRINIVIL,ZESTRIL) 10 MG tablet Take 1 tablet QAM and 1/2 tablet QPM 04/03/18   Claretta Fraise, MD  omeprazole (PRILOSEC) 20 MG capsule Take 2 capsules (40 mg total) by mouth  daily. 04/03/18   Claretta Fraise, MD  ondansetron (ZOFRAN) 4 MG tablet TAKE 1 TABLET BY MOUTH EVERY 8 HOURS AS NEEDED FOR NAUSEA 03/02/16   [provider]  oxyCODONE-acetaminophen (PERCOCET) 10-325 MG tablet TK 1 T PO Q 6 H PRN P 12/27/17   [provider]  oxyCODONE-acetaminophen (PERCOCET/ROXICET) 5-325 MG tablet Take by mouth. 04/17/14   [provider]  pravastatin (PRAVACHOL) 20 MG tablet Take 1 tablet (20 mg total) by mouth daily. 04/03/18   Claretta Fraise, MD  Psyllium (METAMUCIL) 30.9 % POWD Take 1 Scoop by mouth daily. 04/03/18   Claretta Fraise, MD  Vitamin D, Ergocalciferol, (DRISDOL) 1.25 MG (50000 UT) CAPS capsule Take 1 capsule (50,000 Units total) by mouth every 7 (seven) days. 04/03/18   Claretta Fraise, MD    Physical Exam: Vitals:   05/25/18 1600 05/25/18 1630 05/25/18 1700 05/25/18 1725  BP: (!) 179/92 (!) 171/98 (!) 163/80 (!) 173/87  Pulse: 86 66 (!) 59 63  Resp: 16 18 13 15   Temp:    97.6 F (36.4 C)  TempSrc:    Oral  SpO2: 91% 95% 94% 95%  Weight:      Height:         General:  Appears calm and comfortable and is NAD Eyes:  PERRL, EOMI, normal lids, iris ENT: hard of hearing, normal lips & tongue, mmm Neck:  no LAD, masses or thyromegaly; no carotid bruits Cardiovascular:  RRR, no m/r/g. No LE edema.  Respiratory:   CTA bilaterally with no wheezes/rales/rhonchi.  Normal respiratory effort. Abdomen:  soft, NT, ND, NABS Skin:  no rash or induration seen on limited exam Musculoskeletal:  grossly normal tone BUE/BLE, good ROM, no bony abnormality Psychiatric:  grossly normal mood and affect, speech fluent and appropriate, AOx3 Neurologic:  CN 2-12 grossly intact, moves all extremities in coordinated  fashion, sensation intact    Radiological Exams on Admission: Dg Chest Port 1 View  Result Date: 05/25/2018 CLINICAL DATA:  Chest pain EXAM: PORTABLE CHEST 1 VIEW COMPARISON:  April 18, 2013 FINDINGS: There is apparent fibrosis in portions of the mid and lower lung zones. There is no frank edema or consolidation. Heart is upper normal in size with pulmonary vascularity normal. Pacemaker leads are attached the right atrium and right ventricle. No adenopathy. There is aortic atherosclerosis. There is postoperative change in the lower cervical region. IMPRESSION: Areas of fibrosis in mid and lower lung zones bilaterally. No frank edema or consolidation. Stable cardiac silhouette. Pacemaker leads attached to right atrium and right ventricle. There is aortic atherosclerosis. Aortic Atherosclerosis (ICD10-I70.0). Electronically Signed   By: Lowella Grip III M.D.   On: 05/25/2018 13:18    EKG: Per Dr. Ralene Bathe,  no evidence of acute ischemia with a paced rhythm   Labs on Admission: I have personally reviewed the available labs and imaging studies at the time of the admission.  Pertinent labs:   Glucose 338 BUN 18/Creatinine 1.64/GFR 38 - stable vs. Mildly above baseline Troponin 0.02 Platelets 141, otherwise normal CBC UA: >500 glucose; small Hgb, 5 ketones  Assessment/Plan Principal Problem:   Chest pain Active Problems:   Diabetic neuropathy with neurologic complication (HCC)   Pure hypercholesterolemia   Pacemaker   Essential hypertension   CKD (chronic kidney disease) stage 3, GFR 30-59 ml/min (HCC)   Chest pain -Patient with substernal chest pain that is fairly acute and unchanged with exertion, but SOB with exertion that has been progressive.  CP improved with NTG. -CXR unremarkable.   -  Initial cardiac troponin negative.  -EKG not indicative of acute ischemia. -He has a h/o CAD with stents but reports not having had ischemic evaluation in some time.  Last cath appears to have  been in 2014.   -GRACE score is 117; which predicts an in-hospital death rate of 1.5%.  -Will plan to place in observation status on telemetry to rule out ACS by overnight observation.  -cycle troponin q6h x 3 and repeat EKG in AM -Continue ASA 81 mg daily -morphine given -Cardiology consultation in AM - NPO for possible stress test  -Will plan to start Heparin drip if enzymes are positive and/or chest pain recurs  CKD -Appears to be generally stable -HCTZ is likely not an ideal choice for him, will hold -Will also hold ACE, although this may be resumed if creatinine is stable on this medication  HTN -Hold HCTZ and lisinopril due to creatinine -Start low-dose lopressor -Add IV prn hydralazine  HLD -Continue Pravastatin and Zetia -Lipids were checked in 8/19 (TC 112, HDL 35, LDL 58, TG 96) so will not repeat at this time  DM -Last A1c was 7.8 in 11/19 -Continue Levemir -Will cover with moderate-scale SSI for now  DVT prophylaxis: Lovenox  Code Status:  DNR - confirmed with patient Family Communication: None present Disposition Plan:  Home once clinically improved Consults called: Cardiology for evaluation in the AM; CardsMaster message sent  Admission status:  It is my clinical opinion that referral for OBSERVATION is reasonable and necessary in this patient based on the above information provided. The aforementioned taken together are felt to place the patient at high risk for further clinical deterioration. However it is anticipated that the patient may be medically stable for discharge from the hospital within 24 to 48 hours.    Karmen Bongo MD [facility]  If note is complete, please contact covering daytime or nighttime physician. [email or web address] Password [facility]  05/25/2018, 5:47 PM

## 2018-05-25 NOTE — ED Provider Notes (Signed)
[facility] EMERGENCY DEPARTMENT Provider Note   CSN: [id number] Arrival date & time: 05/25/18  1248     History   Chief Complaint Chief Complaint  Patient presents with  . Chest Pain    HPI Thomas Maldonado is a 85 y.o. male.  The history is provided by the patient, the EMS personnel and medical records. No language interpreter was used.  Chest Pain     Thomas Maldonado is a 85 y.o. male who presents to the Emergency Department complaining of chest pain.  He presents to the ED by EMS complaining of severe chest pain.  Pain is described as sharp and constant in nature, radiates to BUE.  No change with movement or activity.  Pain began this morning.  He has associated nausea, no diaphoresis, sob.  He has chronic BLE edema, worsening over the last few days.  Pain is similar to prior MI.   Past Medical History:  Diagnosis Date  . Abdominal pain   . Acute upper respiratory infections of unspecified site   . Chronic kidney disease, stage III (moderate) (HCC)   . Coronary atherosclerosis of unspecified type of vessel, native or graft 1999  . Dizziness and giddiness 2007  . Esophageal reflux   . Headache(784.0)   . Heart disease, unspecified   . History of colonoscopy 02/2011  . Hypertension   . Nausea   . Other chronic pain   . Problems with hearing   . Problems with sight   . Pure hypercholesterolemia   . Type II or unspecified type diabetes mellitus with renal manifestations, not stated as uncontrolled(250.40)     Patient Active Problem List   Diagnosis Date Noted  . Chest pain 05/25/2018  . Essential hypertension 05/08/2018  . Eosinophilia 01/10/2018  . Diabetic neuropathy with neurologic complication ([place]) 80/07/4915  . Pure hypercholesterolemia 01/09/2018  . Coronary artery disease of native heart with stable angina pectoris ([place]) 01/09/2018  . Pacemaker 01/09/2018  . Cerebral ataxia ([place]) 01/09/2018    Past Surgical History:  Procedure Laterality Date   . COLON SURGERY    . FOOT SURGERY Left 1982  . HERNIA REPAIR    . KNEE ARTHROSCOPY Right 2000  . NECK SURGERY  2008   Plate in neck  . PACEMAKER INSERTION  2014  . SHOULDER SURGERY  2000  . TRANSURETHRAL PROSTATECTOMY WITH GYRUS INSTRUMENTS  1999        Home Medications    Prior to Admission medications   Medication Sig Start Date End Date Taking? Authorizing Provider  aspirin 81 MG tablet Take 81 mg by mouth daily.    [provider]  Blood Glucose Monitoring Suppl (ACCU-CHEK GUIDE) w/Device KIT 1 each by Does not apply route 2 (two) times daily. 05/05/18   Claretta Fraise, MD  celecoxib (CELEBREX) 200 MG capsule Take 1 capsule (200 mg total) by mouth daily. 04/03/18   Claretta Fraise, MD  Cyanocobalamin (B-12 COMPLIANCE INJECTION) 1000 MCG/ML KIT Inject as directed.    [provider]  ezetimibe (ZETIA) 10 MG tablet Take 1 tablet (10 mg total) by mouth daily. 04/03/18   Claretta Fraise, MD  glipiZIDE (GLUCOTROL) 10 MG tablet Take 1 tablet (10 mg total) by mouth 2 (two) times daily before a meal. 04/03/18   Claretta Fraise, MD  glucose blood test strip Use as instructed 05/05/18   Claretta Fraise, MD  hydrochlorothiazide (HYDRODIURIL) 25 MG tablet Take 1 tablet (25 mg total) by mouth daily. 04/03/18   Claretta Fraise, MD  insulin detemir (LEVEMIR) 100 UNIT/ML injection Inject 0.5 mLs (50 Units total) into the skin every morning. 04/03/18 07/02/18  Claretta Fraise, MD  insulin regular (HUMULIN R) 100 units/mL injection INJECT SUBCUTANEOUSLY 6 TO  10 UNITS TWO TIMES A DAY  BEFORE MEALS , PER SLIDING  SCALE UP TO 50 UNITS PER  DAY MAX 05/03/18   Claretta Fraise, MD  Lancets (ACCU-CHEK SOFT TOUCH) lancets Use as instructed 05/05/18   Claretta Fraise, MD  lisinopril (PRINIVIL,ZESTRIL) 10 MG tablet Take 1 tablet QAM and 1/2 tablet QPM 04/03/18   Claretta Fraise, MD  omeprazole (PRILOSEC) 20 MG capsule Take 2 capsules (40 mg total) by mouth daily. 04/03/18   Claretta Fraise, MD   ondansetron (ZOFRAN) 4 MG tablet TAKE 1 TABLET BY MOUTH EVERY 8 HOURS AS NEEDED FOR NAUSEA 03/02/16   [provider]  oxyCODONE-acetaminophen (PERCOCET) 10-325 MG tablet TK 1 T PO Q 6 H PRN P 12/27/17   [provider]  oxyCODONE-acetaminophen (PERCOCET/ROXICET) 5-325 MG tablet Take by mouth. 04/17/14   [provider]  pravastatin (PRAVACHOL) 20 MG tablet Take 1 tablet (20 mg total) by mouth daily. 04/03/18   Claretta Fraise, MD  Psyllium (METAMUCIL) 30.9 % POWD Take 1 Scoop by mouth daily. 04/03/18   Claretta Fraise, MD  Vitamin D, Ergocalciferol, (DRISDOL) 1.25 MG (50000 UT) CAPS capsule Take 1 capsule (50,000 Units total) by mouth every 7 (seven) days. 04/03/18   Claretta Fraise, MD    Family History Family History  Problem Relation Age of Onset  . Diabetes Father   . Cancer Brother   . Heart disease Brother   . Diabetes Brother   . Cancer Brother   . Heart disease Brother   . Diabetes Brother   . Cancer Brother   . Heart disease Brother   . Diabetes Brother   . Cancer Brother   . Heart disease Brother   . Cancer Brother   . Heart disease Brother   . Cancer Brother   . Heart disease Brother   . Cancer Sister   . Heart disease Sister   . Diabetes Sister   . Cancer Sister   . Heart disease Sister   . Diabetes Sister   . Cancer Sister   . Heart disease Sister   . Cancer Sister   . Heart disease Sister     Social History Social History   Tobacco Use  . Smoking status: Former Smoker    Types: Cigarettes    Last attempt to quit: 11/02/1981    Years since quitting: 36.5  . Smokeless tobacco: Never Used  Substance Use Topics  . Alcohol use: No  . Drug use: No     Allergies   Metformin and related and Prednisone   Review of Systems Review of Systems  Cardiovascular: Positive for chest pain.  All other systems reviewed and are negative.    Physical Exam Updated Vital Signs BP (!) 157/91   Pulse (!) 59   Temp 98.4 F (36.9 C)  (Oral)   Resp 13   Ht 6' (1.829 m)   Wt 101.8 kg   SpO2 96%   BMI 30.43 kg/m   Physical Exam Vitals signs and nursing note reviewed.  Constitutional:      Appearance: He is well-developed.  HENT:     Head: Normocephalic and atraumatic.  Cardiovascular:     Rate and Rhythm: Normal rate and regular rhythm.     Heart sounds: No murmur.  Pulmonary:  Effort: Pulmonary effort is normal. No respiratory distress.     Breath sounds: Normal breath sounds.  Abdominal:     Palpations: Abdomen is soft.     Tenderness: There is no abdominal tenderness. There is no guarding or rebound.  Musculoskeletal:        General: No tenderness.     Comments: Pitting edema to BLE.  2+ DP pulses bilaterally  Skin:    General: Skin is warm and dry.  Neurological:     Mental Status: He is alert and oriented to person, place, and time.     Comments: Mildly confused  Psychiatric:        Behavior: Behavior normal.      ED Treatments / Results  Labs (all labs ordered are listed, but only abnormal results are displayed) Labs Reviewed  COMPREHENSIVE METABOLIC PANEL - Abnormal; Notable for the following components:      Result Value   Glucose, Bld 338 (*)    Creatinine, Ser 1.64 (*)    Calcium 8.8 (*)    Total Bilirubin 0.1 (*)    GFR calc non Af Amer 38 (*)    GFR calc Af Amer 44 (*)    All other components within normal limits  CBC WITH DIFFERENTIAL/PLATELET - Abnormal; Notable for the following components:   Platelets 141 (*)    Eosinophils Absolute 1.3 (*)    All other components within normal limits  URINALYSIS, ROUTINE W REFLEX MICROSCOPIC - Abnormal; Notable for the following components:   Glucose, UA >=500 (*)    Hgb urine dipstick SMALL (*)    Ketones, ur 5 (*)    All other components within normal limits  LIPASE, BLOOD  I-STAT TROPONIN, ED    EKG None  Radiology Dg Chest Port 1 View  Result Date: 05/25/2018 CLINICAL DATA:  Chest pain EXAM: PORTABLE CHEST 1 VIEW COMPARISON:   April 18, 2013 FINDINGS: There is apparent fibrosis in portions of the mid and lower lung zones. There is no frank edema or consolidation. Heart is upper normal in size with pulmonary vascularity normal. Pacemaker leads are attached the right atrium and right ventricle. No adenopathy. There is aortic atherosclerosis. There is postoperative change in the lower cervical region. IMPRESSION: Areas of fibrosis in mid and lower lung zones bilaterally. No frank edema or consolidation. Stable cardiac silhouette. Pacemaker leads attached to right atrium and right ventricle. There is aortic atherosclerosis. Aortic Atherosclerosis (ICD10-I70.0). Electronically Signed   By: Lowella Grip III M.D.   On: 05/25/2018 13:18    Procedures Procedures (including critical care time)  Medications Ordered in ED Medications  nitroGLYCERIN (NITROSTAT) SL tablet 0.4 mg (has no administration in time range)  oxyCODONE-acetaminophen (PERCOCET/ROXICET) 5-325 MG per tablet 1 tablet (1 tablet Oral Given 05/25/18 1516)    And  oxyCODONE (Oxy IR/ROXICODONE) immediate release tablet 5 mg (5 mg Oral Given 05/25/18 1516)  morphine 4 MG/ML injection 4 mg (4 mg Intravenous Given 05/25/18 1516)     Initial Impression / Assessment and Plan / ED Course  I have reviewed the triage vital signs and the nursing notes.  Pertinent labs & imaging results that were available during my care of the patient were reviewed by me and considered in my medical decision making (see chart for details).     Pt with hx/o CAD, CKD here for evaluation of CP. Pain is similar to prior MI per pt.  EKG with paced rhythm, no acute ischemic changes.  Medicine consulted for observation admission  for chest pain.    Final Clinical Impressions(s) / ED Diagnoses   Final diagnoses:  None    ED Discharge Orders    None       Quintella Reichert, MD 05/25/18 1610

## 2018-05-26 ENCOUNTER — Other Ambulatory Visit: Payer: Self-pay

## 2018-05-26 ENCOUNTER — Ambulatory Visit (HOSPITAL_COMMUNITY): Payer: Medicare Other

## 2018-05-26 ENCOUNTER — Encounter (HOSPITAL_COMMUNITY)
Admission: EM | Disposition: A | Discharge status: Home / Self Care | Payer: Self-pay | Source: Home / Self Care | Attending: Emergency Medicine

## 2018-05-26 ENCOUNTER — Encounter (HOSPITAL_COMMUNITY): Payer: Self-pay | Admitting: Physician Assistant

## 2018-05-26 DIAGNOSIS — I10 Essential (primary) hypertension: Secondary | ICD-10-CM | POA: Diagnosis not present

## 2018-05-26 DIAGNOSIS — R072 Precordial pain: Secondary | ICD-10-CM

## 2018-05-26 DIAGNOSIS — I214 Non-ST elevation (NSTEMI) myocardial infarction: Secondary | ICD-10-CM | POA: Diagnosis not present

## 2018-05-26 DIAGNOSIS — N183 Chronic kidney disease, stage 3 (moderate): Secondary | ICD-10-CM | POA: Diagnosis not present

## 2018-05-26 DIAGNOSIS — E1140 Type 2 diabetes mellitus with diabetic neuropathy, unspecified: Secondary | ICD-10-CM

## 2018-05-26 DIAGNOSIS — E1149 Type 2 diabetes mellitus with other diabetic neurological complication: Secondary | ICD-10-CM

## 2018-05-26 DIAGNOSIS — I2510 Atherosclerotic heart disease of native coronary artery without angina pectoris: Secondary | ICD-10-CM | POA: Diagnosis not present

## 2018-05-26 DIAGNOSIS — R7989 Other specified abnormal findings of blood chemistry: Secondary | ICD-10-CM | POA: Diagnosis not present

## 2018-05-26 DIAGNOSIS — R778 Other specified abnormalities of plasma proteins: Secondary | ICD-10-CM

## 2018-05-26 DIAGNOSIS — I129 Hypertensive chronic kidney disease with stage 1 through stage 4 chronic kidney disease, or unspecified chronic kidney disease: Secondary | ICD-10-CM | POA: Diagnosis not present

## 2018-05-26 DIAGNOSIS — E1122 Type 2 diabetes mellitus with diabetic chronic kidney disease: Secondary | ICD-10-CM | POA: Diagnosis not present

## 2018-05-26 HISTORY — PX: LEFT HEART CATH AND CORONARY ANGIOGRAPHY: CATH118249

## 2018-05-26 LAB — BASIC METABOLIC PANEL
Anion gap: 6 (ref 5–15)
BUN: 14 mg/dL (ref 8–23)
CHLORIDE: 107 mmol/L (ref 98–111)
CO2: 28 mmol/L (ref 22–32)
Calcium: 9.3 mg/dL (ref 8.9–10.3)
Creatinine, Ser: 1.42 mg/dL — ABNORMAL HIGH (ref 0.61–1.24)
GFR calc Af Amer: 53 mL/min — ABNORMAL LOW (ref >60)
GFR calc non Af Amer: 46 mL/min — ABNORMAL LOW (ref >60)
Glucose, Bld: 151 mg/dL — ABNORMAL HIGH (ref 70–99)
POTASSIUM: 4.3 mmol/L (ref 3.5–5.1)
SODIUM: 141 mmol/L (ref 135–145)

## 2018-05-26 LAB — TROPONIN I
Troponin I: 0.15 ng/mL (ref <0.03)
Troponin I: 0.25 ng/mL (ref <0.03)

## 2018-05-26 LAB — GLUCOSE, CAPILLARY
Glucose-Capillary: 183 mg/dL — ABNORMAL HIGH (ref 70–99)
Glucose-Capillary: 152 mg/dL — ABNORMAL HIGH (ref 70–99)
Glucose-Capillary: 93 mg/dL (ref 70–99)

## 2018-05-26 SURGERY — LEFT HEART CATH AND CORONARY ANGIOGRAPHY
Anesthesia: LOCAL

## 2018-05-26 MED ORDER — METOPROLOL TARTRATE 25 MG PO TABS: 12.5000 mg | ORAL_TABLET | Freq: Two times a day (BID) | ORAL | 0 refills | Status: DC

## 2018-05-26 MED ORDER — SODIUM CHLORIDE 0.9 % WEIGHT BASED INFUSION: 1.0000 mL/kg/h | INTRAVENOUS | Status: DC

## 2018-05-26 MED ORDER — VERAPAMIL HCL 2.5 MG/ML IV SOLN
INTRAVENOUS | Status: DC | PRN
  Administered 2018-05-26: 10 mL via INTRA_ARTERIAL

## 2018-05-26 MED ORDER — SODIUM CHLORIDE 0.9 % WEIGHT BASED INFUSION
3.0000 mL/kg/h | INTRAVENOUS | Status: AC
  Administered 2018-05-26: 3 mL/kg/h via INTRAVENOUS

## 2018-05-26 MED ORDER — FENTANYL CITRATE (PF) 100 MCG/2ML IJ SOLN
INTRAMUSCULAR | Status: DC | PRN
  Administered 2018-05-26: 25 ug via INTRAVENOUS

## 2018-05-26 MED ORDER — SODIUM CHLORIDE 0.9% FLUSH: 3.0000 mL | INTRAVENOUS | Status: DC | PRN

## 2018-05-26 MED ORDER — HEPARIN (PORCINE) 25000 UT/250ML-% IV SOLN
1200.0000 [IU]/h | INTRAVENOUS | Status: DC
  Administered 2018-05-26: 1200 [IU]/h via INTRAVENOUS
  Filled 2018-05-26: qty 250

## 2018-05-26 MED ORDER — HEPARIN (PORCINE) IN NACL 1000-0.9 UT/500ML-% IV SOLN
INTRAVENOUS | Status: DC | PRN
  Administered 2018-05-26 (×2): 500 mL

## 2018-05-26 MED ORDER — MIDAZOLAM HCL 2 MG/2ML IJ SOLN
INTRAMUSCULAR | Status: DC | PRN
  Administered 2018-05-26: 1 mg via INTRAVENOUS

## 2018-05-26 MED ORDER — ASPIRIN 81 MG PO CHEW: 81.0000 mg | CHEWABLE_TABLET | ORAL | Status: DC

## 2018-05-26 MED ORDER — SODIUM CHLORIDE 0.9% FLUSH: 3.0000 mL | Freq: Two times a day (BID) | INTRAVENOUS | Status: DC

## 2018-05-26 MED ORDER — SODIUM CHLORIDE 0.9 % IV SOLN: INTRAVENOUS | Status: DC

## 2018-05-26 MED ORDER — ONDANSETRON HCL 4 MG/2ML IJ SOLN: 4.0000 mg | Freq: Four times a day (QID) | INTRAMUSCULAR | Status: DC | PRN

## 2018-05-26 MED ORDER — LISINOPRIL 10 MG PO TABS: 10.0000 mg | ORAL_TABLET | Freq: Every day | ORAL | Status: DC

## 2018-05-26 MED ORDER — LIDOCAINE HCL (PF) 1 % IJ SOLN
INTRAMUSCULAR | Status: DC | PRN
  Administered 2018-05-26: 2 mL via INTRADERMAL

## 2018-05-26 MED ORDER — HEPARIN (PORCINE) IN NACL 1000-0.9 UT/500ML-% IV SOLN
INTRAVENOUS | Status: AC
  Filled 2018-05-26: qty 500

## 2018-05-26 MED ORDER — ACETAMINOPHEN 325 MG PO TABS: 650.0000 mg | ORAL_TABLET | ORAL | Status: DC | PRN

## 2018-05-26 MED ORDER — SODIUM CHLORIDE 0.9 % IV SOLN: 250.0000 mL | INTRAVENOUS | Status: DC | PRN

## 2018-05-26 MED ORDER — LIDOCAINE HCL (PF) 1 % IJ SOLN
INTRAMUSCULAR | Status: AC
  Filled 2018-05-26: qty 30

## 2018-05-26 MED ORDER — VITAMIN D (ERGOCALCIFEROL) 1.25 MG (50000 UNIT) PO CAPS: 50000.0000 [IU] | ORAL_CAPSULE | ORAL | Status: DC

## 2018-05-26 MED ORDER — HEPARIN (PORCINE) IN NACL 1000-0.9 UT/500ML-% IV SOLN
INTRAVENOUS | Status: AC
  Filled 2018-05-26: qty 1000

## 2018-05-26 MED ORDER — HEPARIN BOLUS VIA INFUSION
4000.0000 [IU] | Freq: Once | INTRAVENOUS | Status: AC
  Administered 2018-05-26: 4000 [IU] via INTRAVENOUS
  Filled 2018-05-26: qty 4000

## 2018-05-26 MED ORDER — IOHEXOL 350 MG/ML SOLN
INTRAVENOUS | Status: DC | PRN
  Administered 2018-05-26: 55 mL via INTRA_ARTERIAL

## 2018-05-26 MED ORDER — MIDAZOLAM HCL 2 MG/2ML IJ SOLN
INTRAMUSCULAR | Status: AC
  Filled 2018-05-26: qty 2

## 2018-05-26 MED ORDER — HEPARIN SODIUM (PORCINE) 1000 UNIT/ML IJ SOLN
INTRAMUSCULAR | Status: DC | PRN
  Administered 2018-05-26: 5000 [IU] via INTRAVENOUS

## 2018-05-26 MED ORDER — FENTANYL CITRATE (PF) 100 MCG/2ML IJ SOLN
INTRAMUSCULAR | Status: AC
  Filled 2018-05-26: qty 2

## 2018-05-26 SURGICAL SUPPLY — 10 items
CATH 5FR JL3.5 JR4 ANG PIG MP (CATHETERS) ×1 IMPLANT
DEVICE RAD COMP TR BAND LRG (VASCULAR PRODUCTS) ×1 IMPLANT
GLIDESHEATH SLEND SS 6F .021 (SHEATH) ×1 IMPLANT
GUIDEWIRE INQWIRE 1.5J.035X260 (WIRE) IMPLANT
INQWIRE 1.5J .035X260CM (WIRE) ×2
KIT HEART LEFT (KITS) ×2 IMPLANT
PACK CARDIAC CATHETERIZATION (CUSTOM PROCEDURE TRAY) ×2 IMPLANT
SYR MEDRAD MARK 7 150ML (SYRINGE) ×2 IMPLANT
TRANSDUCER W/STOPCOCK (MISCELLANEOUS) ×2 IMPLANT
TUBING CIL FLEX 10 FLL-RA (TUBING) ×2 IMPLANT

## 2018-05-26 NOTE — Discharge Instructions (Signed)
Radial Site Care  This sheet gives you information about how to care for yourself after your procedure. Your health care provider may also give you more specific instructions. If you have problems or questions, contact your health care provider. What can I expect after the procedure? After the procedure, it is common to have:  Bruising and tenderness at the catheter insertion area. Follow these instructions at home: Medicines  Take over-the-counter and prescription medicines only as told by your health care provider. Insertion site care  Follow instructions from your health care provider about how to take care of your insertion site. Make sure you: ? Wash your hands with soap and water before you change your bandage (dressing). If soap and water are not available, use hand sanitizer. ? Change your dressing as told by your health care provider. ? Leave stitches (sutures), skin glue, or adhesive strips in place. These skin closures may need to stay in place for 2 weeks or longer. If adhesive strip edges start to loosen and curl up, you may trim the loose edges. Do not remove adhesive strips completely unless your health care provider tells you to do that.  Check your insertion site every day for signs of infection. Check for: ? Redness, swelling, or pain. ? Fluid or blood. ? Pus or a bad smell. ? Warmth.  Do not take baths, swim, or use a hot tub until your health care provider approves.  You may shower 24-48 hours after the procedure, or as directed by your health care provider. ? Remove the dressing and gently wash the site with plain soap and water. ? Pat the area dry with a clean towel. ? Do not rub the site. That could cause bleeding.  Do not apply powder or lotion to the site. Activity   For 24 hours after the procedure, or as directed by your health care provider: ? Do not flex or bend the affected arm. ? Do not push or pull heavy objects with the affected arm. ? Do not  drive yourself home from the hospital or clinic. You may drive 24 hours after the procedure unless your health care provider tells you not to. ? Do not operate machinery or power tools.  Do not lift anything that is heavier than 10 lb (4.5 kg), or the limit that you are told, until your health care provider says that it is safe.  Ask your health care provider when it is okay to: ? Return to work or school. ? Resume usual physical activities or sports. ? Resume sexual activity. General instructions  If the catheter site starts to bleed, raise your arm and put firm pressure on the site. If the bleeding does not stop, get help right away. This is a medical emergency.  If you went home on the same day as your procedure, a responsible adult should be with you for the first 24 hours after you arrive home.  Keep all follow-up visits as told by your health care provider. This is important. Contact a health care provider if:  You have a fever.  You have redness, swelling, or yellow drainage around your insertion site. Get help right away if:  You have unusual pain at the radial site.  The catheter insertion area swells very fast.  The insertion area is bleeding, and the bleeding does not stop when you hold steady pressure on the area.b CALL 911  Your arm or hand becomes pale, cool, tingly, or numb. These symptoms may represent a  serious problem that is an emergency. Do not wait to see if the symptoms will go away. Get medical help right away. Call your local emergency services (911 in the [place] Do not drive yourself to the hospital. Summary  After the procedure, it is common to have bruising and tenderness at the site.  Follow instructions from your health care provider about how to take care of your radial site wound. Check the wound every day for signs of infection.  Do not lift anything that is heavier than 10 lb (4.5 kg), or the limit that you are told, until your health care  provider says that it is safe. This information is not intended to replace advice given to you by your health care provider. Make sure you discuss any questions you have with your health care provider. Document Released: 06/12/2010 Document Revised: 06/15/2017 Document Reviewed: 06/15/2017 Elsevier Interactive Patient Education  2019 [employer].

## 2018-05-26 NOTE — Progress Notes (Signed)
Patient placed on cath board for afternoon, will also order echo as cath lab is aware to avoid LV gram with renal insufficiency. I also discussed temporary rescinding of DNR during cath per protocol and patient is in agreement. Dayna Dunn PA-C

## 2018-05-26 NOTE — Progress Notes (Signed)
ANTICOAGULATION CONSULT NOTE - Initial Consult  Pharmacy Consult for heparin Indication: chest pain/ACS  Allergies  Allergen Reactions  . Metformin And Related Nausea Only  . Prednisone Anxiety    Pt reports "with all steriods gets anxious, tremors, and tears me all to pieces"     Patient Measurements: Height: 6' (182.9 cm) Weight: 224 lb 6.4 oz (101.8 kg) IBW/kg (Calculated) : 77.6 Heparin Dosing Weight: 98.4kg  Vital Signs: Temp: 97.6 F (36.4 C) (01/03 0126) Temp Source: Oral (01/03 0126) BP: 138/63 (01/03 0126) Pulse Rate: 59 (01/03 0126)  Labs: Recent Labs    05/25/18 1256 05/25/18 1749 05/25/18 2349  HGB 13.2  --   --   HCT 42.4  --   --   PLT 141*  --   --   CREATININE 1.64*  --   --   TROPONINI  --  0.25* 0.25*    Estimated Creatinine Clearance: 42.9 mL/min (A) (by C-G formula based on SCr of 1.64 mg/dL (H)).   Medical History: Past Medical History:  Diagnosis Date  . Abdominal pain   . Acute upper respiratory infections of unspecified site   . AV block    a. s/p [employer] PPM - [facility].  Marland Kitchen CAD (coronary artery disease)    a.  inferolateral MI 1999 s/p stent to Cx and DES to prox LAD 2013.  Marland Kitchen Chronic kidney disease, stage III (moderate) (HCC)   . Coronary atherosclerosis of unspecified type of vessel, native or graft 1999  . Diabetes mellitus without complication ([place])   . Dizziness and giddiness 2007  . Esophageal reflux   . Headache(784.0)   . Heart disease, unspecified   . History of colonoscopy 02/2011  . Hypertension   . Nausea   . NSVT (nonsustained ventricular tachycardia) ([place])   . Other chronic pain   . Problems with hearing   . Problems with sight   . Pure hypercholesterolemia   . Type II or unspecified type diabetes mellitus with renal manifestations, not stated as uncontrolled(250.40)     Assessment: 51 YOM presenting with CP and elevated troponin, pharmacy consulted for heparin start.  No AC PTA, and last lovenox 40mg  subQ dose  given 1/2 @2227  (>10h).  CBC wnl.    Goal of Therapy:  Heparin level 0.3-0.7 units/ml Monitor platelets by anticoagulation protocol: Yes   Plan:  Heparin 4000 unit bolus, and gtt at 1200 units/hr F/u 6 hour heparin level Daily heparin level, CBC, s/s bleeding  Bertis Ruddy, PharmD Clinical Pharmacist Please check AMION for all [facility] numbers 05/26/2018 8:35 AM

## 2018-05-26 NOTE — Interval H&P Note (Signed)
Cath Lab Visit (complete for each Cath Lab visit)  Clinical Evaluation Leading to the Procedure:   ACS: Yes.    Non-ACS:    Anginal Classification: CCS IV  Anti-ischemic medical therapy: Minimal Therapy (1 class of medications)  Non-Invasive Test Results: No non-invasive testing performed  Prior CABG: No previous CABG      History and Physical Interval Note:  05/26/2018 10:42 AM  Thomas Maldonado  has presented today for surgery, with the diagnosis of chest pain  The various methods of treatment have been discussed with the patient and family. After consideration of risks, benefits and other options for treatment, the patient has consented to  Procedure(s): LEFT HEART CATH AND CORONARY ANGIOGRAPHY (N/A) as a surgical intervention .  The patient's history has been reviewed, patient examined, no change in status, stable for surgery.  I have reviewed the patient's chart and labs.  Questions were answered to the patient's satisfaction.     Larae Grooms

## 2018-05-26 NOTE — Progress Notes (Signed)
Inpatient Diabetes Program Recommendations  AACE/ADA: New Consensus Statement on Inpatient Glycemic Control (2015)  Target Ranges:  Prepandial:   less than 140 mg/dL      Peak postprandial:   less than 180 mg/dL (1-2 hours)      Critically ill patients:  140 - 180 mg/dL   Lab Results  Component Value Date   GLUCAP 93 05/26/2018   HGBA1C 7.8 (H) 04/03/2018    Review of Glycemic Control Results for Thomas Maldonado, Thomas Maldonado (MRN [id number]) as of 05/26/2018 12:07  Ref. Range 05/25/2018 17:20 05/25/2018 21:15 05/26/2018 01:38 05/26/2018 07:15 05/26/2018 11:56  Glucose-Capillary Latest Ref Range: 70 - 99 mg/dL 194 (H) 196 (H) 183 (H) 152 (H) 93   Diabetes history: DM2 Outpatient Diabetes medications: Levemir 50 units + Humulin R 6-10 tid meal coverage + Glucotrol 10 mg bid Current orders for Inpatient glycemic control: Levemir 50 units + Novolog moderate correction tid + hs 0-5 units  Inpatient Diabetes Program Recommendations:   Noted patient currently in cath lab. While in the hospital: -decrease Levemir by 50% -Decrease Novolog correction to sensitive tid + hs 0-5 -Add Novolog meal coverage as needed  Thank you, Bethena Roys E. Rande Roylance, RN, MSN, CDE  Diabetes Coordinator Inpatient Glycemic Control Team Team Pager [phone] (8am-5pm) 05/26/2018 12:09 PM

## 2018-05-26 NOTE — Consult Note (Addendum)
Cardiology Consultation:   Patient ID: [name]; [id number]; 1933/11/21   Admit date: 05/25/2018 Date of Consult: 05/26/2018  Primary Care Provider: Claretta Fraise, MD Primary Cardiologist: Dr. Mauricio Po  Chief Complaint: chest pain  Patient Profile:   Thomas Maldonado is a 85 y.o. male with a hx of CAD with inferolateral MI 1999 s/p stent to Cx and DES to prox LAD 2013, DM, HLD, advanced AV block s/p Medtronic PPM, NSVT, CKD III (baseline Cr appears 1.3-1.6), GERD, HTN who is being seen today for the evaluation of chest pain/elevated troponin at the request of Dr. Lorin Mercy.  History of Present Illness:   It appears he's followed by Dr. Mauricio Po for general cardiology and Dr. Bethel Born for EP. Dr. Shearon Stalls note in 11/2016 references a normal stress test 1 year prior and an echocardiogram 06/2016 showing EF 50-55% and mild TR. He reports he's been having intermittent chest pain for the last 2 years but the last month has gone fairly well.  However, yesterday while at rest he developed substernal chest pain with radiation to the left arm.  It was constant with intermittent sharp sensation.  He reports it was quite severe and worse when he got up and moved around.  He came to the ER for persistent discomfort and received aspirin, morphine and sublingual nitroglycerin with relief.  He does report some dyspnea on exertion as well.  He has been pain-free overnight. The pain was not worse with inspiration or palpation. Labs reveal troponin 0.02 then 0.25 then 0.25, otherwise glucose 336, Cr 1.64, plt 141. CXR with areas of fibrosis in mid and lower lung zones bilaterally but otherwise no acute finding. He has been intermittently hypertensive to 179/92, pulse and pulse ox wnl. He does note some ongoing nausea.  Past Medical History:  Diagnosis Date  . Acute upper respiratory infections of unspecified site   . AV block    a. s/p Medtronic PPM - [facility].  Marland Kitchen CAD (coronary artery disease)    a.  inferolateral MI  1999 s/p stent to Cx and DES to prox LAD 2013.  Marland Kitchen Chronic kidney disease, stage III (moderate) (HCC)   . Coronary atherosclerosis of unspecified type of vessel, native or graft 1999  . Diabetes mellitus without complication ([place])   . Esophageal reflux   . Hypertension   . Nausea   . NSVT (nonsustained ventricular tachycardia) ([place])   . Other chronic pain   . Problems with hearing   . Problems with sight   . Pure hypercholesterolemia   . Type II or unspecified type diabetes mellitus with renal manifestations, not stated as uncontrolled(250.40)     Past Surgical History:  Procedure Laterality Date  . COLON SURGERY    . FOOT SURGERY Left 1982  . HERNIA REPAIR    . KNEE ARTHROSCOPY Right 2000  . NECK SURGERY  2008   Plate in neck  . PACEMAKER INSERTION  2014  . SHOULDER SURGERY  2000  . TRANSURETHRAL PROSTATECTOMY WITH GYRUS INSTRUMENTS  1999     Inpatient Medications: Scheduled Meds: . aspirin EC  81 mg Oral Daily  . ezetimibe  10 mg Oral Daily  . heparin  4,000 Units Intravenous Once  . insulin aspart  0-15 Units Subcutaneous TID WC  . insulin aspart  0-5 Units Subcutaneous QHS  . insulin detemir  50 Units Subcutaneous [id number]  . metoprolol tartrate  12.5 mg Oral BID  . pantoprazole  40 mg Oral Daily  . pravastatin  20 mg Oral QHS  Continuous Infusions: . heparin     PRN Meds: acetaminophen, hydrALAZINE, morphine injection, nitroGLYCERIN, ondansetron (ZOFRAN) IV, oxyCODONE-acetaminophen **AND** oxyCODONE  Home Meds: Prior to Admission medications   Medication Sig Start Date End Date Taking? Authorizing Provider  acetaminophen (TYLENOL) 500 MG tablet Take 500-1,000 mg by mouth every 6 (six) hours as needed (FOR HEADACHES).   Yes [provider]  aspirin 81 MG tablet Take 81 mg by mouth every evening.    Yes [provider]  celecoxib (CELEBREX) 200 MG capsule Take 1 capsule (200 mg total) by mouth daily. Patient taking differently: Take 200 mg by  mouth at bedtime.  04/03/18  Yes Claretta Fraise, MD  ezetimibe (ZETIA) 10 MG tablet Take 1 tablet (10 mg total) by mouth daily. 04/03/18  Yes Claretta Fraise, MD  glipiZIDE (GLUCOTROL) 10 MG tablet Take 1 tablet (10 mg total) by mouth 2 (two) times daily before a meal. 04/03/18  Yes Stacks, Cletus Gash, MD  insulin detemir (LEVEMIR) 100 UNIT/ML injection Inject 0.5 mLs (50 Units total) into the skin every morning. Patient taking differently: Inject 50 Units into the skin daily before breakfast.  04/03/18 07/02/18 Yes Stacks, Cletus Gash, MD  insulin regular (HUMULIN R) 100 units/mL injection INJECT SUBCUTANEOUSLY 6 TO  10 UNITS TWO TIMES A DAY  BEFORE MEALS , PER SLIDING  SCALE UP TO 50 UNITS PER  DAY MAX Patient taking differently: Inject 6-10 Units into the skin See admin instructions. Inject 6-10 units into the skin two times a day before meals, per sliding scale- up to 50 units/day max 05/03/18  Yes Stacks, Cletus Gash, MD  lisinopril (PRINIVIL,ZESTRIL) 10 MG tablet Take 1 tablet QAM and 1/2 tablet QPM Patient taking differently: Take 10 mg by mouth daily.  04/03/18  Yes Claretta Fraise, MD  meclizine (ANTIVERT) 25 MG tablet Take 25 mg by mouth 2 (two) times daily as needed for dizziness or nausea.   Yes [provider]  omeprazole (PRILOSEC) 20 MG capsule Take 2 capsules (40 mg total) by mouth daily. Patient taking differently: Take 20 mg by mouth 2 (two) times daily before a meal.  04/03/18  Yes Stacks, Cletus Gash, MD  ondansetron (ZOFRAN) 4 MG tablet Take 4 mg by mouth every 8 (eight) hours as needed for nausea.  03/02/16  Yes [provider]  oxyCODONE-acetaminophen (PERCOCET) 10-325 MG tablet Take 1 tablet by mouth every 6 (six) hours as needed for pain.  12/27/17  Yes [provider]  pravastatin (PRAVACHOL) 20 MG tablet Take 1 tablet (20 mg total) by mouth daily. Patient taking differently: Take 20 mg by mouth at bedtime.  04/03/18  Yes Claretta Fraise, MD  Vitamin D, Ergocalciferol,  (DRISDOL) 1.25 MG (50000 UT) CAPS capsule Take 1 capsule (50,000 Units total) by mouth every 7 (seven) days. Patient taking differently: Take 50,000 Units by mouth every Thursday.  04/03/18  Yes Stacks, Cletus Gash, MD  Blood Glucose Monitoring Suppl (ACCU-CHEK GUIDE) w/Device KIT 1 each by Does not apply route 2 (two) times daily. 05/05/18   Claretta Fraise, MD  glucose blood test strip Use as instructed 05/05/18   Claretta Fraise, MD  hydrochlorothiazide (HYDRODIURIL) 25 MG tablet Take 1 tablet (25 mg total) by mouth daily. Patient not taking: Reported on 05/25/2018 04/03/18   Claretta Fraise, MD  Lancets (ACCU-CHEK SOFT [facility]) lancets Use as instructed 05/05/18   Claretta Fraise, MD    Allergies:    Allergies  Allergen Reactions  . Metformin And Related Nausea Only  . Prednisone Anxiety  Pt reports "with all steriods gets anxious, tremors, and tears me all to pieces"     Social History:   Social History   Socioeconomic History  . Marital status: Divorced    Spouse name: Not on file  . Number of children: Not on file  . Years of education: 6  . Highest education level: Not on file  Occupational History  . Occupation: retired  Scientific laboratory technician  . Financial resource strain: Not on file  . Food insecurity:    Worry: Not on file    Inability: Not on file  . Transportation needs:    Medical: Not on file    Non-medical: Not on file  Tobacco Use  . Smoking status: Former Smoker    Types: Cigarettes    Last attempt to quit: 11/02/1981    Years since quitting: 36.5  . Smokeless tobacco: Never Used  Substance and Sexual Activity  . Alcohol use: No  . Drug use: No  . Sexual activity: Not Currently  Lifestyle  . Physical activity:    Days per week: Not on file    Minutes per session: Not on file  . Stress: Not on file  Relationships  . Social connections:    Talks on phone: Not on file    Gets together: Not on file    Attends religious service: Not on file    Active member of club or  organization: Not on file    Attends meetings of clubs or organizations: Not on file    Relationship status: Not on file  . Intimate partner violence:    Fear of current or ex partner: Not on file    Emotionally abused: Not on file    Physically abused: Not on file    Forced sexual activity: Not on file  Other Topics Concern  . Not on file  Social History Narrative  . Not on file    Family History:   The patient's family history includes Cancer in his brother, brother, brother, brother, brother, brother, sister, sister, sister, and sister; Diabetes in his brother, brother, brother, father, sister, and sister; Heart disease in his brother, brother, brother, brother, brother, brother, sister, sister, sister, and sister.  ROS:  Please see the history of present illness.  All other ROS reviewed and negative.     Physical Exam/Data:   Vitals:   05/25/18 1700 05/25/18 1725 05/25/18 2119 05/26/18 0126  BP: (!) 163/80 (!) 173/87 134/73 138/63  Pulse: (!) 59 63 64 (!) 59  Resp: 13 15 20 18   Temp:  97.6 F (36.4 C) 97.6 F (36.4 C) 97.6 F (36.4 C)  TempSrc:  Oral Oral Oral  SpO2: 94% 95% 96% 94%  Weight:      Height:       No intake or output data in the 24 hours ending 05/26/18 0845 Filed Weights   05/25/18 1254  Weight: 101.8 kg   Body mass index is 30.43 kg/m.  General: Well developed, well nourished WM, in no acute distress. Hard of hearing Head: Normocephalic, atraumatic, sclera non-icteric, no xanthomas, nares are without discharge.  Neck: Negative for carotid bruits. JVD not elevated. Lungs: Coarse BS throughout. No wheezes or rhonchi. Breathing is unlabored. Heart: RRR with S1 S2. No murmurs, rubs, or gallops appreciated. Abdomen: Soft, non-tender, non-distended with normoactive bowel sounds. No hepatomegaly. No rebound/guarding. No obvious abdominal masses. Msk:  Strength and tone appear normal for age. Extremities: No clubbing or cyanosis. Trace BLE edema.  Distal  pedal pulses are 2+ and equal bilaterally. Neuro: Alert and oriented X 3. No facial asymmetry. No focal deficit. Moves all extremities spontaneously. Psych:  Responds to questions appropriately with a normal affect.  EKG:  The EKG was personally reviewed and demonstrates V paced rhythm, interpretation limited by this  Laboratory Data:  Chemistry Recent Labs  Lab 05/25/18 1256  NA 140  K 4.5  CL 106  CO2 26  GLUCOSE 338*  BUN 18  CREATININE 1.64*  CALCIUM 8.8*  GFRNONAA 38*  GFRAA 44*  ANIONGAP 8    Recent Labs  Lab 05/25/18 1256  PROT 6.9  ALBUMIN 3.6  AST 22  ALT 16  ALKPHOS 86  BILITOT 0.1*   Hematology Recent Labs  Lab 05/25/18 1256  WBC 8.4  RBC 4.40  HGB 13.2  HCT 42.4  MCV 96.4  MCH 30.0  MCHC 31.1  RDW 12.7  PLT 141*   Cardiac Enzymes Recent Labs  Lab 05/25/18 1749 05/25/18 2349 05/26/18 0637  TROPONINI 0.25* 0.25* 0.15*    Recent Labs  Lab 05/25/18 1309  TROPIPOC 0.02    BNPNo results for input(s): BNP, PROBNP in the last 168 hours.  DDimer No results for input(s): DDIMER in the last 168 hours.  Radiology/Studies:  Dg Chest Port 1 View  Result Date: 05/25/2018 CLINICAL DATA:  Chest pain EXAM: PORTABLE CHEST 1 VIEW COMPARISON:  April 18, 2013 FINDINGS: There is apparent fibrosis in portions of the mid and lower lung zones. There is no frank edema or consolidation. Heart is upper normal in size with pulmonary vascularity normal. Pacemaker leads are attached the right atrium and right ventricle. No adenopathy. There is aortic atherosclerosis. There is postoperative change in the lower cervical region. IMPRESSION: Areas of fibrosis in mid and lower lung zones bilaterally. No frank edema or consolidation. Stable cardiac silhouette. Pacemaker leads attached to right atrium and right ventricle. There is aortic atherosclerosis. Aortic Atherosclerosis (ICD10-I70.0). Electronically Signed   By: Lowella Grip III M.D.   On: 05/25/2018 13:18     Assessment and Plan:   1. CAD here with chest pain/left arm pain and elevated troponin concerning for NSTEMI. D/c Lovenox and start heparin per pharmacy. Suspect patient would benefit from cath, will review with Dr. Percival Spanish. Risks and benefits of cardiac catheterization have been discussed with the patient.  These include bleeding, infection, kidney damage, stroke, heart attack, death.  The patient understands these risks and is willing to proceed if advised. Will check echo to assess LVEF as we would wish to avoid LV gram with cath given renal insufficiency.   2. AV block s/p PPM - followed as OP by [facility], continue outpatient monitoring.  3. CKD III - baseline Cr appears 1.3-1.6, follow. Lisinopril, celebrex on hold.  4. HTN - elevated on admission but currently normotensive. Home lisinopril on hold. Was on HCTZ previously but indicates he was not taking this. Consider addition of amlodipine while in patient.   5. Hyperlipidemia - on pravastatin, unclear why not on higher intensity statin. LDL well controlled at 58 in 12/2017. Can defer to primary cardiologist as OP.  For questions or updates, please contact [place] Please consult [email or web address] for contact info under Cardiology/STEMI.    Signed, Charlie Pitter, PA-C  05/26/2018 8:45 AM   History and all data above reviewed.  Patient examined.   Very nice man with prior history of coronary disease as described above.  He presents now with new onset chest discomfort similar to his previous  angina although he does not remember the details specifically.  Is different than other aches and pains that he has.  It is substernal.  He got up to about a 9 out of 10 in intensity with some radiation down his left arm.  Cardiac enzymes are mildly elevated as above.  EKG is uninterpretable.  Is otherwise been well.  He gets around and does chores.  He is widower x2 and lives alone.  He has no acute shortness of breath, PND or orthopnea.  He has some  chronic nausea.  I agree with the findings as above.  The patient exam reveals COR:RRR  ,  Lungs:   Diffuse fine crackles  ,  Abd: Positive bowel sounds, no rebound no guarding, Ext No edema  .  All available labs, radiology testing, previous records reviewed. Agree with documented assessment and plan. NSTEMI :  Cardiac cath indicated.  The patient understands that risks included but are not limited to stroke (1 in 1000), death (1 in 36), kidney failure [usually temporary] (1 in 500), bleeding (1 in 200), allergic reaction [possibly serious] (1 in 200).  The patient understands and agrees to proceed.   ABNORMAL PULMONARY EXAM:  Fine crackles on exam.  He does have some abnormality on his CXR.  He will work up as an out patient perhaps with pulmonary follow up.  He has no prior pulmonary diagonsis.     Minus Breeding  8:57 AM  05/26/2018

## 2018-05-26 NOTE — Discharge Summary (Signed)
Physician Discharge Summary  Thomas Maldonado NID:[id number] DOB: [birth year] DOA: 05/25/2018  PCP: Claretta Fraise, MD  Admit date: 05/25/2018 Discharge date: 05/26/2018  Admitted From: home Discharge disposition: home   Recommendations for Outpatient Follow-Up:   1. PFTs- referral to pulm-- ?pulm fibrosis on chest x ray 2. bMP 1 week   Discharge Diagnosis:   Principal Problem:   Chest pain Active Problems:   Diabetic neuropathy with neurologic complication (HCC)   Pure hypercholesterolemia   Pacemaker   Essential hypertension   CKD (chronic kidney disease) stage 3, GFR 30-59 ml/min (HCC)   Elevated troponin    Discharge Condition: Improved.  Diet recommendation: Low sodium, heart healthy.  Carbohydrate-modified  Wound care: None.  Code status: Full.   History of Present Illness:   Thomas Maldonado is a 85 y.o. male with medical history significant of DM; HLD; visual and hearing deficits; CAD h/o stents to LAD and circumflex; pacemaker placement due to 2nd degree heart block; and stage 3 CKD presenting with chest pain.  He developed chest pain "pretty bad" this morning and it is better now after ASA and NTG.  It was up to 10 and is down to 6 now.  Substernal pain with occasional radiation to the left arm.  Nothing makes it worse, but he has DOE.  He has not had a stress test in years and no cath since 2014.    Hospital Course by Problem:    Chest pain with known history of CAD  -CE flat -cath:  Mid LAD lesion is 10% stenosed. Mid Cx to Dist Cx lesion is 25% stenosed. The left ventricular systolic function is normal. The left ventricular ejection fraction is 50-55% by visual estimate. LV end diastolic pressure is normal. LVEDP 11 mm Hg. There is no aortic valve stenosis. Patent stents.  Apical hypokinesis, perhaps related to prior MI.  Does not appear to be in a pattern of Takotsubo cardiomyopathy.   -no sign of PE/no risk factors -discussed with cardiology, no  further inpatient work up   AV block s/p PPM  - followed as OP by [facility], continue outpatient monitoring.  CKD III - baseline Cr appears 1.3-1.6  HTN  - elevated on admission but currently normotensive.   Hyperlipidemia  - on pravastatin     Medical Consultants:   cards   Discharge Exam:   Vitals:   05/26/18 1245 05/26/18 1300  BP: 137/68 139/66  Pulse: 63 60  Resp:    Temp:    SpO2: 96% 97%   Vitals:   05/26/18 1215 05/26/18 1235 05/26/18 1245 05/26/18 1300  BP: (!) 141/67 140/65 137/68 139/66  Pulse: 65 64 63 60  Resp:      Temp:      TempSrc:      SpO2: 96% (!) 89% 96% 97%  Weight:      Height:        General exam: Appears calm and comfortable. No hypoxia   The results of significant diagnostics from this hospitalization (including imaging, microbiology, ancillary and laboratory) are listed below for reference.     Procedures and Diagnostic Studies:   Dg Chest Port 1 View  Result Date: 05/25/2018 CLINICAL DATA:  Chest pain EXAM: PORTABLE CHEST 1 VIEW COMPARISON:  April 18, 2013 FINDINGS: There is apparent fibrosis in portions of the mid and lower lung zones. There is no frank edema or consolidation. Heart is upper normal in size with pulmonary vascularity normal. Pacemaker leads are attached the right  atrium and right ventricle. No adenopathy. There is aortic atherosclerosis. There is postoperative change in the lower cervical region. IMPRESSION: Areas of fibrosis in mid and lower lung zones bilaterally. No frank edema or consolidation. Stable cardiac silhouette. Pacemaker leads attached to right atrium and right ventricle. There is aortic atherosclerosis. Aortic Atherosclerosis (ICD10-I70.0). Electronically Signed   By: Lowella Grip III M.D.   On: 05/25/2018 13:18     Labs:   Basic Metabolic Panel: Recent Labs  Lab 05/25/18 1256 05/26/18 0841  NA 140 141  K 4.5 4.3  CL 106 107  CO2 26 28  GLUCOSE 338* 151*  BUN 18 14  CREATININE  1.64* 1.42*  CALCIUM 8.8* 9.3   GFR Estimated Creatinine Clearance: 49.5 mL/min (A) (by C-G formula based on SCr of 1.42 mg/dL (H)). Liver Function Tests: Recent Labs  Lab 05/25/18 1256  AST 22  ALT 16  ALKPHOS 86  BILITOT 0.1*  PROT 6.9  ALBUMIN 3.6   Recent Labs  Lab 05/25/18 1256  LIPASE 20   No results for input(s): AMMONIA in the last 168 hours. Coagulation profile No results for input(s): INR, PROTIME in the last 168 hours.  CBC: Recent Labs  Lab 05/25/18 1256  WBC 8.4  NEUTROABS 5.0  HGB 13.2  HCT 42.4  MCV 96.4  PLT 141*   Cardiac Enzymes: Recent Labs  Lab 05/25/18 1749 05/25/18 2349 05/26/18 0637  TROPONINI 0.25* 0.25* 0.15*   BNP: Invalid input(s): POCBNP CBG: Recent Labs  Lab 05/25/18 1720 05/25/18 2115 05/26/18 0138 05/26/18 0715 05/26/18 1156  GLUCAP 194* 196* 183* 152* 93   D-Dimer No results for input(s): DDIMER in the last 72 hours. Hgb A1c No results for input(s): HGBA1C in the last 72 hours. Lipid Profile No results for input(s): CHOL, HDL, LDLCALC, TRIG, CHOLHDL, LDLDIRECT in the last 72 hours. Thyroid function studies No results for input(s): TSH, T4TOTAL, T3FREE, THYROIDAB in the last 72 hours.  Invalid input(s): FREET3 Anemia work up No results for input(s): VITAMINB12, FOLATE, FERRITIN, TIBC, IRON, RETICCTPCT in the last 72 hours. Microbiology No results found for this or any previous visit (from the past 240 hour(s)).   Discharge Instructions:   Discharge Instructions    Diet - low sodium heart healthy   Complete by:  As directed    Diet Carb Modified   Complete by:  As directed    Discharge instructions   Complete by:  As directed    Please follow post-cath orders   Increase activity slowly   Complete by:  As directed      Allergies as of 05/26/2018      Reactions   Metformin And Related Nausea Only   Prednisone Anxiety   Pt reports "with all steriods gets anxious, tremors, and tears me all to pieces"       Medication List    STOP taking these medications   hydrochlorothiazide 25 MG tablet Commonly known as:  HYDRODIURIL     TAKE these medications   ACCU-CHEK GUIDE w/Device Kit 1 each by Does not apply route 2 (two) times daily.   accu-chek soft touch lancets Use as instructed   acetaminophen 500 MG tablet Commonly known as:  TYLENOL Take 500-1,000 mg by mouth every 6 (six) hours as needed (FOR HEADACHES).   aspirin 81 MG tablet Take 81 mg by mouth every evening.   celecoxib 200 MG capsule Commonly known as:  CELEBREX Take 1 capsule (200 mg total) by mouth daily. What changed:  when  to take this   ezetimibe 10 MG tablet Commonly known as:  ZETIA Take 1 tablet (10 mg total) by mouth daily.   glipiZIDE 10 MG tablet Commonly known as:  GLUCOTROL Take 1 tablet (10 mg total) by mouth 2 (two) times daily before a meal.   glucose blood test strip Use as instructed   insulin detemir 100 UNIT/ML injection Commonly known as:  LEVEMIR Inject 0.5 mLs (50 Units total) into the skin every morning. What changed:  when to take this   insulin regular 100 units/mL injection Commonly known as:  HUMULIN R INJECT SUBCUTANEOUSLY 6 TO  10 UNITS TWO TIMES A DAY  BEFORE MEALS , PER SLIDING  SCALE UP TO 50 UNITS PER  DAY MAX What changed:    how much to take  how to take this  when to take this  additional instructions   lisinopril 10 MG tablet Commonly known as:  PRINIVIL,ZESTRIL Take 1 tablet (10 mg total) by mouth daily.   meclizine 25 MG tablet Commonly known as:  ANTIVERT Take 25 mg by mouth 2 (two) times daily as needed for dizziness or nausea.   metoprolol tartrate 25 MG tablet Commonly known as:  LOPRESSOR Take 0.5 tablets (12.5 mg total) by mouth 2 (two) times daily.   omeprazole 20 MG capsule Commonly known as:  PRILOSEC Take 2 capsules (40 mg total) by mouth daily. What changed:    how much to take  when to take this   ondansetron 4 MG tablet Commonly  known as:  ZOFRAN Take 4 mg by mouth every 8 (eight) hours as needed for nausea.   oxyCODONE-acetaminophen 10-325 MG tablet Commonly known as:  PERCOCET Take 1 tablet by mouth every 6 (six) hours as needed for pain.   pravastatin 20 MG tablet Commonly known as:  PRAVACHOL Take 1 tablet (20 mg total) by mouth daily. What changed:  when to take this   Vitamin D (Ergocalciferol) 1.25 MG (50000 UT) Caps capsule Commonly known as:  DRISDOL Take 1 capsule (50,000 Units total) by mouth every Thursday. Start taking on:  June 01, 2018      Follow-up Information    Claretta Fraise, MD Follow up in 1 week(s).   Specialty:  Family Medicine Why:  for BMP and HgbA1c Contact information: [place] [address] [phone]            Time coordinating discharge: 25 min  Signed:  Geradine Girt DO  [facility] 05/26/2018, 1:10 PM

## 2018-05-26 NOTE — H&P (View-Only) (Signed)
Cardiology Consultation:   Patient ID: Thomas Maldonado; [id number]; [birth year]   Admit date: 05/25/2018 Date of Consult: 05/26/2018  Primary Care Provider: Claretta Fraise, MD Primary Cardiologist: Dr. Mauricio Po  Chief Complaint: chest pain  Patient Profile:   Thomas Maldonado is a 85 y.o. male with a hx of CAD with inferolateral MI 1999 s/p stent to Cx and DES to prox LAD 2013, DM, HLD, advanced AV block s/p Medtronic PPM, NSVT, CKD III (baseline Cr appears 1.3-1.6), GERD, HTN who is being seen today for the evaluation of chest pain/elevated troponin at the request of Dr. Lorin Mercy.  History of Present Illness:   It appears he's followed by Dr. Mauricio Po for general cardiology and Dr. Bethel Born for EP. Dr. Shearon Stalls note in 11/2016 references a normal stress test 1 year prior and an echocardiogram 06/2016 showing EF 50-55% and mild TR. He reports he's been having intermittent chest pain for the last 2 years but the last month has gone fairly well.  However, yesterday while at rest he developed substernal chest pain with radiation to the left arm.  It was constant with intermittent sharp sensation.  He reports it was quite severe and worse when he got up and moved around.  He came to the ER for persistent discomfort and received aspirin, morphine and sublingual nitroglycerin with relief.  He does report some dyspnea on exertion as well.  He has been pain-free overnight. The pain was not worse with inspiration or palpation. Labs reveal troponin 0.02 then 0.25 then 0.25, otherwise glucose 336, Cr 1.64, plt 141. CXR with areas of fibrosis in mid and lower lung zones bilaterally but otherwise no acute finding. He has been intermittently hypertensive to 179/92, pulse and pulse ox wnl. He does note some ongoing nausea.  Past Medical History:  Diagnosis Date  . Acute upper respiratory infections of unspecified site   . AV block    a. s/p Medtronic PPM - [facility].  Marland Kitchen CAD (coronary artery disease)    a.  inferolateral MI  1999 s/p stent to Cx and DES to prox LAD 2013.  Marland Kitchen Chronic kidney disease, stage III (moderate) (HCC)   . Coronary atherosclerosis of unspecified type of vessel, native or graft 1999  . Diabetes mellitus without complication ([place])   . Esophageal reflux   . Hypertension   . Nausea   . NSVT (nonsustained ventricular tachycardia) ([place])   . Other chronic pain   . Problems with hearing   . Problems with sight   . Pure hypercholesterolemia   . Type II or unspecified type diabetes mellitus with renal manifestations, not stated as uncontrolled(250.40)     Past Surgical History:  Procedure Laterality Date  . COLON SURGERY    . FOOT SURGERY Left 1982  . HERNIA REPAIR    . KNEE ARTHROSCOPY Right 2000  . NECK SURGERY  2008   Plate in neck  . PACEMAKER INSERTION  2014  . SHOULDER SURGERY  2000  . TRANSURETHRAL PROSTATECTOMY WITH GYRUS INSTRUMENTS  1999     Inpatient Medications: Scheduled Meds: . aspirin EC  81 mg Oral Daily  . ezetimibe  10 mg Oral Daily  . heparin  4,000 Units Intravenous Once  . insulin aspart  0-15 Units Subcutaneous TID WC  . insulin aspart  0-5 Units Subcutaneous QHS  . insulin detemir  50 Units Subcutaneous [id number]  . metoprolol tartrate  12.5 mg Oral BID  . pantoprazole  40 mg Oral Daily  . pravastatin  20 mg Oral QHS  Continuous Infusions: . heparin     PRN Meds: acetaminophen, hydrALAZINE, morphine injection, nitroGLYCERIN, ondansetron (ZOFRAN) IV, oxyCODONE-acetaminophen **AND** oxyCODONE  Home Meds: Prior to Admission medications   Medication Sig Start Date End Date Taking? Authorizing Provider  acetaminophen (TYLENOL) 500 MG tablet Take 500-1,000 mg by mouth every 6 (six) hours as needed (FOR HEADACHES).   Yes [provider]  aspirin 81 MG tablet Take 81 mg by mouth every evening.    Yes [provider]  celecoxib (CELEBREX) 200 MG capsule Take 1 capsule (200 mg total) by mouth daily. Patient taking differently: Take 200 mg by  mouth at bedtime.  04/03/18  Yes Claretta Fraise, MD  ezetimibe (ZETIA) 10 MG tablet Take 1 tablet (10 mg total) by mouth daily. 04/03/18  Yes Claretta Fraise, MD  glipiZIDE (GLUCOTROL) 10 MG tablet Take 1 tablet (10 mg total) by mouth 2 (two) times daily before a meal. 04/03/18  Yes Stacks, Cletus Gash, MD  insulin detemir (LEVEMIR) 100 UNIT/ML injection Inject 0.5 mLs (50 Units total) into the skin every morning. Patient taking differently: Inject 50 Units into the skin daily before breakfast.  04/03/18 07/02/18 Yes Stacks, Cletus Gash, MD  insulin regular (HUMULIN R) 100 units/mL injection INJECT SUBCUTANEOUSLY 6 TO  10 UNITS TWO TIMES A DAY  BEFORE MEALS , PER SLIDING  SCALE UP TO 50 UNITS PER  DAY MAX Patient taking differently: Inject 6-10 Units into the skin See admin instructions. Inject 6-10 units into the skin two times a day before meals, per sliding scale- up to 50 units/day max 05/03/18  Yes Stacks, Cletus Gash, MD  lisinopril (PRINIVIL,ZESTRIL) 10 MG tablet Take 1 tablet QAM and 1/2 tablet QPM Patient taking differently: Take 10 mg by mouth daily.  04/03/18  Yes Claretta Fraise, MD  meclizine (ANTIVERT) 25 MG tablet Take 25 mg by mouth 2 (two) times daily as needed for dizziness or nausea.   Yes [provider]  omeprazole (PRILOSEC) 20 MG capsule Take 2 capsules (40 mg total) by mouth daily. Patient taking differently: Take 20 mg by mouth 2 (two) times daily before a meal.  04/03/18  Yes Stacks, Cletus Gash, MD  ondansetron (ZOFRAN) 4 MG tablet Take 4 mg by mouth every 8 (eight) hours as needed for nausea.  03/02/16  Yes [provider]  oxyCODONE-acetaminophen (PERCOCET) 10-325 MG tablet Take 1 tablet by mouth every 6 (six) hours as needed for pain.  12/27/17  Yes [provider]  pravastatin (PRAVACHOL) 20 MG tablet Take 1 tablet (20 mg total) by mouth daily. Patient taking differently: Take 20 mg by mouth at bedtime.  04/03/18  Yes Claretta Fraise, MD  Vitamin D, Ergocalciferol,  (DRISDOL) 1.25 MG (50000 UT) CAPS capsule Take 1 capsule (50,000 Units total) by mouth every 7 (seven) days. Patient taking differently: Take 50,000 Units by mouth every Thursday.  04/03/18  Yes Stacks, Cletus Gash, MD  Blood Glucose Monitoring Suppl (ACCU-CHEK GUIDE) w/Device KIT 1 each by Does not apply route 2 (two) times daily. 05/05/18   Claretta Fraise, MD  glucose blood test strip Use as instructed 05/05/18   Claretta Fraise, MD  hydrochlorothiazide (HYDRODIURIL) 25 MG tablet Take 1 tablet (25 mg total) by mouth daily. Patient not taking: Reported on 05/25/2018 04/03/18   Claretta Fraise, MD  Lancets (ACCU-CHEK SOFT [facility]) lancets Use as instructed 05/05/18   Claretta Fraise, MD    Allergies:    Allergies  Allergen Reactions  . Metformin And Related Nausea Only  . Prednisone Anxiety  Pt reports "with all steriods gets anxious, tremors, and tears me all to pieces"     Social History:   Social History   Socioeconomic History  . Marital status: Divorced    Spouse name: Not on file  . Number of children: Not on file  . Years of education: 6  . Highest education level: Not on file  Occupational History  . Occupation: retired  Scientific laboratory technician  . Financial resource strain: Not on file  . Food insecurity:    Worry: Not on file    Inability: Not on file  . Transportation needs:    Medical: Not on file    Non-medical: Not on file  Tobacco Use  . Smoking status: Former Smoker    Types: Cigarettes    Last attempt to quit: 11/02/1981    Years since quitting: 36.5  . Smokeless tobacco: Never Used  Substance and Sexual Activity  . Alcohol use: No  . Drug use: No  . Sexual activity: Not Currently  Lifestyle  . Physical activity:    Days per week: Not on file    Minutes per session: Not on file  . Stress: Not on file  Relationships  . Social connections:    Talks on phone: Not on file    Gets together: Not on file    Attends religious service: Not on file    Active member of club or  organization: Not on file    Attends meetings of clubs or organizations: Not on file    Relationship status: Not on file  . Intimate partner violence:    Fear of current or ex partner: Not on file    Emotionally abused: Not on file    Physically abused: Not on file    Forced sexual activity: Not on file  Other Topics Concern  . Not on file  Social History Narrative  . Not on file    Family History:   The patient's family history includes Cancer in his brother, brother, brother, brother, brother, brother, sister, sister, sister, and sister; Diabetes in his brother, brother, brother, father, sister, and sister; Heart disease in his brother, brother, brother, brother, brother, brother, sister, sister, sister, and sister.  ROS:  Please see the history of present illness.  All other ROS reviewed and negative.     Physical Exam/Data:   Vitals:   05/25/18 1700 05/25/18 1725 05/25/18 2119 05/26/18 0126  BP: (!) 163/80 (!) 173/87 134/73 138/63  Pulse: (!) 59 63 64 (!) 59  Resp: 13 15 20 18   Temp:  97.6 F (36.4 C) 97.6 F (36.4 C) 97.6 F (36.4 C)  TempSrc:  Oral Oral Oral  SpO2: 94% 95% 96% 94%  Weight:      Height:       No intake or output data in the 24 hours ending 05/26/18 0845 Filed Weights   05/25/18 1254  Weight: 101.8 kg   Body mass index is 30.43 kg/m.  General: Well developed, well nourished WM, in no acute distress. Hard of hearing Head: Normocephalic, atraumatic, sclera non-icteric, no xanthomas, nares are without discharge.  Neck: Negative for carotid bruits. JVD not elevated. Lungs: Coarse BS throughout. No wheezes or rhonchi. Breathing is unlabored. Heart: RRR with S1 S2. No murmurs, rubs, or gallops appreciated. Abdomen: Soft, non-tender, non-distended with normoactive bowel sounds. No hepatomegaly. No rebound/guarding. No obvious abdominal masses. Msk:  Strength and tone appear normal for age. Extremities: No clubbing or cyanosis. Trace BLE edema.  Distal  pedal pulses are 2+ and equal bilaterally. Neuro: Alert and oriented X 3. No facial asymmetry. No focal deficit. Moves all extremities spontaneously. Psych:  Responds to questions appropriately with a normal affect.  EKG:  The EKG was personally reviewed and demonstrates V paced rhythm, interpretation limited by this  Laboratory Data:  Chemistry Recent Labs  Lab 05/25/18 1256  NA 140  K 4.5  CL 106  CO2 26  GLUCOSE 338*  BUN 18  CREATININE 1.64*  CALCIUM 8.8*  GFRNONAA 38*  GFRAA 44*  ANIONGAP 8    Recent Labs  Lab 05/25/18 1256  PROT 6.9  ALBUMIN 3.6  AST 22  ALT 16  ALKPHOS 86  BILITOT 0.1*   Hematology Recent Labs  Lab 05/25/18 1256  WBC 8.4  RBC 4.40  HGB 13.2  HCT 42.4  MCV 96.4  MCH 30.0  MCHC 31.1  RDW 12.7  PLT 141*   Cardiac Enzymes Recent Labs  Lab 05/25/18 1749 05/25/18 2349 05/26/18 0637  TROPONINI 0.25* 0.25* 0.15*    Recent Labs  Lab 05/25/18 1309  TROPIPOC 0.02    BNPNo results for input(s): BNP, PROBNP in the last 168 hours.  DDimer No results for input(s): DDIMER in the last 168 hours.  Radiology/Studies:  Dg Chest Port 1 View  Result Date: 05/25/2018 CLINICAL DATA:  Chest pain EXAM: PORTABLE CHEST 1 VIEW COMPARISON:  April 18, 2013 FINDINGS: There is apparent fibrosis in portions of the mid and lower lung zones. There is no frank edema or consolidation. Heart is upper normal in size with pulmonary vascularity normal. Pacemaker leads are attached the right atrium and right ventricle. No adenopathy. There is aortic atherosclerosis. There is postoperative change in the lower cervical region. IMPRESSION: Areas of fibrosis in mid and lower lung zones bilaterally. No frank edema or consolidation. Stable cardiac silhouette. Pacemaker leads attached to right atrium and right ventricle. There is aortic atherosclerosis. Aortic Atherosclerosis (ICD10-I70.0). Electronically Signed   By: Lowella Grip III M.D.   On: 05/25/2018 13:18     Assessment and Plan:   1. CAD here with chest pain/left arm pain and elevated troponin concerning for NSTEMI. D/c Lovenox and start heparin per pharmacy. Suspect patient would benefit from cath, will review with Dr. Percival Spanish. Risks and benefits of cardiac catheterization have been discussed with the patient.  These include bleeding, infection, kidney damage, stroke, heart attack, death.  The patient understands these risks and is willing to proceed if advised. Will check echo to assess LVEF as we would wish to avoid LV gram with cath given renal insufficiency.   2. AV block s/p PPM - followed as OP by [facility], continue outpatient monitoring.  3. CKD III - baseline Cr appears 1.3-1.6, follow. Lisinopril, celebrex on hold.  4. HTN - elevated on admission but currently normotensive. Home lisinopril on hold. Was on HCTZ previously but indicates he was not taking this. Consider addition of amlodipine while in patient.   5. Hyperlipidemia - on pravastatin, unclear why not on higher intensity statin. LDL well controlled at 58 in 12/2017. Can defer to primary cardiologist as OP.  For questions or updates, please contact [facility] Please consult [email or web address] for contact info under Cardiology/STEMI.    Signed, Charlie Pitter, PA-C  05/26/2018 8:45 AM   History and all data above reviewed.  Patient examined.   Very nice man with prior history of coronary disease as described above.  He presents now with new onset chest discomfort similar to his previous  angina although he does not remember the details specifically.  Is different than other aches and pains that he has.  It is substernal.  He got up to about a 9 out of 10 in intensity with some radiation down his left arm.  Cardiac enzymes are mildly elevated as above.  EKG is uninterpretable.  Is otherwise been well.  He gets around and does chores.  He is widower x2 and lives alone.  He has no acute shortness of breath, PND or orthopnea.  He has some  chronic nausea.  I agree with the findings as above.  The patient exam reveals COR:RRR  ,  Lungs:   Diffuse fine crackles  ,  Abd: Positive bowel sounds, no rebound no guarding, Ext No edema  .  All available labs, radiology testing, previous records reviewed. Agree with documented assessment and plan. NSTEMI :  Cardiac cath indicated.  The patient understands that risks included but are not limited to stroke (1 in 1000), death (1 in 19), kidney failure [usually temporary] (1 in 500), bleeding (1 in 200), allergic reaction [possibly serious] (1 in 200).  The patient understands and agrees to proceed.   ABNORMAL PULMONARY EXAM:  Fine crackles on exam.  He does have some abnormality on his CXR.  He will work up as an out patient perhaps with pulmonary follow up.  He has no prior pulmonary diagonsis.     Minus Breeding  8:57 AM  05/26/2018

## 2018-05-29 ENCOUNTER — Encounter (HOSPITAL_COMMUNITY): Payer: Self-pay | Admitting: Interventional Cardiology

## 2018-05-30 ENCOUNTER — Ambulatory Visit (INDEPENDENT_AMBULATORY_CARE_PROVIDER_SITE_OTHER): Payer: Medicare Other | Admitting: Family Medicine

## 2018-05-30 ENCOUNTER — Encounter: Payer: Self-pay | Admitting: Family Medicine

## 2018-05-30 VITALS — BP 180/81 | HR 60 | Temp 97.0°F | Ht 72.0 in | Wt 222.4 lb

## 2018-05-30 DIAGNOSIS — T161XXA Foreign body in right ear, initial encounter: Secondary | ICD-10-CM

## 2018-05-30 DIAGNOSIS — H60391 Other infective otitis externa, right ear: Secondary | ICD-10-CM | POA: Diagnosis not present

## 2018-05-30 MED ORDER — CIPROFLOXACIN-HYDROCORTISONE 0.2-1 % OT SUSP: 3.0000 [drp] | Freq: Two times a day (BID) | OTIC | 0 refills | Status: DC

## 2018-05-30 NOTE — Progress Notes (Signed)
Chief Complaint  Patient presents with  . Ear Pain    pt here today c/o right ear/face pain    HPI  Patient presents today for pain in the right ear for several days. Not hearing well in spite of using an aide on the right. (Has one for the left ear, but left is so bad it doesn't help.)  PMH: Smoking status noted ROS: Per HPI  Objective: BP (!) 180/81   Pulse 60   Temp (!) 97 F (36.1 C) (Oral)   Ht 6' (1.829 m)   Wt 222 lb 6 oz (100.9 kg)   BMI 30.16 kg/m  Gen: NAD, alert, cooperative with exam HEENT: NCAT, EOMI, PERRL. Right ear canal has foreign body. A 1.4 cm piece of cerumen coated tubing from the hearing aide was removed with alligator forcep. REinspection showed further a clear piece of plastic that was removed and identified as the interior plug/flange of the aide/ tubing. Hearing restored instantly. Soreness persisted CV: RRR, good S1/S2, no murmur Resp: CTABL, no wheezes, non-labored Neuro: Alert and oriented, No gross deficits Follow up BP 164/86 Assessment and plan:  1. Other infective acute otitis externa of right ear   2. Acute foreign body of right ear canal, initial encounter     Meds ordered this encounter  Medications  . ciprofloxacin-hydrocortisone (CIPRO HC OTIC) OTIC suspension    Sig: Place 3 drops into both ears 2 (two) times daily. For one week    Dispense:  10 mL    Refill:  0    No orders of the defined types were placed in this encounter.   Follow up as needed.  Claretta Fraise, MD

## 2018-06-03 ENCOUNTER — Other Ambulatory Visit: Payer: Self-pay | Admitting: Family Medicine

## 2018-06-03 NOTE — Telephone Encounter (Signed)
Pt aware 04/03/18 rx for omeprazole was sent to [facility] and to call pharmacy to get rx filled.

## 2018-06-06 ENCOUNTER — Other Ambulatory Visit: Payer: Self-pay | Admitting: *Deleted

## 2018-06-06 MED ORDER — INSULIN DETEMIR 100 UNIT/ML ~~LOC~~ SOLN: 50.0000 [IU] | SUBCUTANEOUS | 0 refills | Status: DC

## 2018-06-12 ENCOUNTER — Ambulatory Visit (INDEPENDENT_AMBULATORY_CARE_PROVIDER_SITE_OTHER): Payer: Medicare Other | Admitting: Family Medicine

## 2018-06-12 ENCOUNTER — Encounter: Payer: Self-pay | Admitting: Family Medicine

## 2018-06-12 VITALS — BP 150/79 | HR 80 | Temp 97.0°F | Ht 72.0 in | Wt 221.2 lb

## 2018-06-12 DIAGNOSIS — I10 Essential (primary) hypertension: Secondary | ICD-10-CM

## 2018-06-12 DIAGNOSIS — G119 Hereditary ataxia, unspecified: Secondary | ICD-10-CM

## 2018-06-12 NOTE — Patient Instructions (Signed)
Resume Metoprolol

## 2018-06-12 NOTE — Progress Notes (Signed)
Subjective:  Patient ID: Thomas Maldonado, male    DOB: [birth year]  Age: 85 y.o. MRN: [id number]  CC: Hypertension (pt here today for BP check)   HPI Thomas Maldonado presents for  follow-up of hypertension. Patient has no history of headache chest pain or shortness of breath or recent cough. Patient also denies symptoms of TIA such as focal numbness or weakness. Patient denies side effects from medication. Stopped tking while hospitalized Continues to have dizziness. Stopped meclizine. Felt it was making it worse.   History Thomas Maldonado has a past medical history of Acute upper respiratory infections of unspecified site, AV block, CAD (coronary artery disease), Chronic kidney disease, stage III (moderate) ([place]), Coronary atherosclerosis of unspecified type of vessel, native or graft (1999), Diabetes mellitus without complication ([place]), Esophageal reflux, Hypertension, Nausea, NSVT (nonsustained ventricular tachycardia) ([place]), Other chronic pain, Problems with hearing, Problems with sight, Pure hypercholesterolemia, and Type II or unspecified type diabetes mellitus with renal manifestations, not stated as uncontrolled(250.40).   He has a past surgical history that includes Neck surgery (2008); Shoulder surgery (2000); Knee arthroscopy (Right, 2000); Transurethral prostatectomy with gyrus instruments (1999); Foot surgery (Left, 1982); Hernia repair; Colon surgery; Pacemaker insertion (2014); and LEFT HEART CATH AND CORONARY ANGIOGRAPHY (N/A, 05/26/2018).   His family history includes Cancer in his brother, brother, brother, brother, brother, brother, sister, sister, sister, and sister; Diabetes in his brother, brother, brother, father, sister, and sister; Heart disease in his brother, brother, brother, brother, brother, brother, sister, sister, sister, and sister.He reports that he quit smoking about 36 years ago. His smoking use included cigarettes. He has never used smokeless tobacco. He reports that he does not  drink alcohol or use drugs.  Current Outpatient Medications on File Prior to Visit  Medication Sig Dispense Refill  . acetaminophen (TYLENOL) 500 MG tablet Take 500-1,000 mg by mouth every 6 (six) hours as needed (FOR HEADACHES).    Marland Kitchen aspirin 81 MG tablet Take 81 mg by mouth every evening.     . Blood Glucose Monitoring Suppl (ACCU-CHEK GUIDE) w/Device KIT 1 each by Does not apply route 2 (two) times daily. 1 kit 0  . celecoxib (CELEBREX) 200 MG capsule Take 1 capsule (200 mg total) by mouth daily. (Patient taking differently: Take 200 mg by mouth at bedtime. ) 90 capsule 1  . ezetimibe (ZETIA) 10 MG tablet Take 1 tablet (10 mg total) by mouth daily. 90 tablet 1  . glipiZIDE (GLUCOTROL) 10 MG tablet Take 1 tablet (10 mg total) by mouth 2 (two) times daily before a meal. 180 tablet 1  . glucose blood test strip Use as instructed 100 each 12  . insulin detemir (LEVEMIR) 100 UNIT/ML injection Inject 0.5 mLs (50 Units total) into the skin every morning. 45 mL 0  . insulin regular (HUMULIN R) 100 units/mL injection INJECT SUBCUTANEOUSLY 6 TO  10 UNITS TWO TIMES A DAY  BEFORE MEALS , PER SLIDING  SCALE UP TO 50 UNITS PER  DAY MAX (Patient taking differently: Inject 6-10 Units into the skin See admin instructions. Inject 6-10 units into the skin two times a day before meals, per sliding scale- up to 50 units/day max) 30 mL 1  . Lancets (ACCU-CHEK SOFT TOUCH) lancets Use as instructed 100 each 12  . lisinopril (PRINIVIL,ZESTRIL) 10 MG tablet Take 1 tablet (10 mg total) by mouth daily.    . meclizine (ANTIVERT) 25 MG tablet Take 25 mg by mouth 2 (two) times daily as needed for dizziness or nausea.    Marland Kitchen  metoprolol tartrate (LOPRESSOR) 25 MG tablet Take 0.5 tablets (12.5 mg total) by mouth 2 (two) times daily. 60 tablet 0  . omeprazole (PRILOSEC) 20 MG capsule Take 2 capsules (40 mg total) by mouth daily. (Patient taking differently: Take 20 mg by mouth 2 (two) times daily before a meal. ) 180 capsule 1  .  ondansetron (ZOFRAN) 4 MG tablet Take 4 mg by mouth every 8 (eight) hours as needed for nausea.     Marland Kitchen oxyCODONE-acetaminophen (PERCOCET) 10-325 MG tablet Take 1 tablet by mouth every 6 (six) hours as needed for pain.   0  . pravastatin (PRAVACHOL) 20 MG tablet Take 1 tablet (20 mg total) by mouth daily. (Patient taking differently: Take 20 mg by mouth at bedtime. ) 90 tablet 1  . Vitamin D, Ergocalciferol, (DRISDOL) 1.25 MG (50000 UT) CAPS capsule Take 1 capsule (50,000 Units total) by mouth every Thursday.     No current facility-administered medications on file prior to visit.     ROS Review of Systems  Constitutional: Negative.   HENT: Negative.   Eyes: Negative for visual disturbance.  Respiratory: Negative for cough and shortness of breath.   Cardiovascular: Negative for chest pain and leg swelling.  Gastrointestinal: Negative for abdominal pain, diarrhea, nausea and vomiting.  Genitourinary: Negative for difficulty urinating.  Musculoskeletal: Negative for arthralgias and myalgias.  Skin: Negative for rash.  Neurological: Positive for dizziness (light headed with poor balance). Negative for headaches.  Psychiatric/Behavioral: Negative for sleep disturbance.    Objective:  BP (!) 150/79   Pulse 80   Temp (!) 97 F (36.1 C) (Oral)   Ht 6' (1.829 m)   Wt 221 lb 4 oz (100.4 kg)   BMI 30.01 kg/m   BP Readings from Last 3 Encounters:  06/12/18 (!) 150/79  05/30/18 (!) 180/81  05/26/18 (!) 143/71    Wt Readings from Last 3 Encounters:  06/12/18 221 lb 4 oz (100.4 kg)  05/30/18 222 lb 6 oz (100.9 kg)  05/25/18 224 lb 6.4 oz (101.8 kg)     Physical Exam Vitals signs reviewed.  Constitutional:      Appearance: He is well-developed.  HENT:     Head: Normocephalic and atraumatic.     Right Ear: External ear normal.     Left Ear: External ear normal.     Mouth/Throat:     Pharynx: No oropharyngeal exudate or posterior oropharyngeal erythema.  Eyes:     Pupils: Pupils  are equal, round, and reactive to light.  Neck:     Musculoskeletal: Normal range of motion and neck supple.  Cardiovascular:     Rate and Rhythm: Normal rate and regular rhythm.     Heart sounds: No murmur.  Pulmonary:     Effort: No respiratory distress.     Breath sounds: Normal breath sounds.  Neurological:     Mental Status: He is alert and oriented to person, place, and time.       Assessment & Plan:   Thomas Maldonado was seen today for hypertension.  Diagnoses and all orders for this visit:  Cerebral ataxia ([place])  Essential hypertension   Allergies as of 06/12/2018      Reactions   Metformin And Related Nausea Only   Prednisone Anxiety   Pt reports "with all steriods gets anxious, tremors, and tears me all to pieces"      Medication List       Accurate as of June 12, 2018  4:35 PM. Always use your  most recent med list.        ACCU-CHEK GUIDE w/Device Kit 1 each by Does not apply route 2 (two) times daily.   accu-chek soft touch lancets Use as instructed   acetaminophen 500 MG tablet Commonly known as:  TYLENOL Take 500-1,000 mg by mouth every 6 (six) hours as needed (FOR HEADACHES).   aspirin 81 MG tablet Take 81 mg by mouth every evening.   celecoxib 200 MG capsule Commonly known as:  CELEBREX Take 1 capsule (200 mg total) by mouth daily.   ezetimibe 10 MG tablet Commonly known as:  ZETIA Take 1 tablet (10 mg total) by mouth daily.   glipiZIDE 10 MG tablet Commonly known as:  GLUCOTROL Take 1 tablet (10 mg total) by mouth 2 (two) times daily before a meal.   glucose blood test strip Use as instructed   insulin detemir 100 UNIT/ML injection Commonly known as:  LEVEMIR Inject 0.5 mLs (50 Units total) into the skin every morning.   insulin regular 100 units/mL injection Commonly known as:  HUMULIN R INJECT SUBCUTANEOUSLY 6 TO  10 UNITS TWO TIMES A DAY  BEFORE MEALS , PER SLIDING  SCALE UP TO 50 UNITS PER  DAY MAX   lisinopril 10 MG  tablet Commonly known as:  PRINIVIL,ZESTRIL Take 1 tablet (10 mg total) by mouth daily.   meclizine 25 MG tablet Commonly known as:  ANTIVERT Take 25 mg by mouth 2 (two) times daily as needed for dizziness or nausea.   metoprolol tartrate 25 MG tablet Commonly known as:  LOPRESSOR Take 0.5 tablets (12.5 mg total) by mouth 2 (two) times daily.   omeprazole 20 MG capsule Commonly known as:  PRILOSEC Take 2 capsules (40 mg total) by mouth daily.   ondansetron 4 MG tablet Commonly known as:  ZOFRAN Take 4 mg by mouth every 8 (eight) hours as needed for nausea.   oxyCODONE-acetaminophen 10-325 MG tablet Commonly known as:  PERCOCET Take 1 tablet by mouth every 6 (six) hours as needed for pain.   pravastatin 20 MG tablet Commonly known as:  PRAVACHOL Take 1 tablet (20 mg total) by mouth daily.   Vitamin D (Ergocalciferol) 1.25 MG (50000 UT) Caps capsule Commonly known as:  DRISDOL Take 1 capsule (50,000 Units total) by mouth every Thursday.       No orders of the defined types were placed in this encounter.   Dizziness from cerebellar ataxia.  Recommended follow-up with Dr. Berdine Addison and or increase the dose of the meclizine to 50 mg twice daily to 3 times daily.  Follow-up: Return in about 1 month (around 07/13/2018).  Claretta Fraise, M.D.

## 2018-06-13 ENCOUNTER — Telehealth: Payer: Self-pay | Admitting: Family Medicine

## 2018-06-13 MED ORDER — METOPROLOL TARTRATE 25 MG PO TABS: 12.5000 mg | ORAL_TABLET | Freq: Two times a day (BID) | ORAL | 0 refills | Status: DC

## 2018-06-13 NOTE — Telephone Encounter (Signed)
Patient daughter aware. °

## 2018-06-23 ENCOUNTER — Emergency Department (HOSPITAL_COMMUNITY)
Admission: EM | Admit: 2018-06-23 | Discharge: 2018-06-24 | Disposition: A | Discharge status: Home / Self Care | Payer: Medicare Other | Attending: Emergency Medicine | Admitting: Emergency Medicine

## 2018-06-23 ENCOUNTER — Other Ambulatory Visit: Payer: Self-pay

## 2018-06-23 ENCOUNTER — Encounter (HOSPITAL_COMMUNITY): Payer: Self-pay | Admitting: Emergency Medicine

## 2018-06-23 DIAGNOSIS — I2510 Atherosclerotic heart disease of native coronary artery without angina pectoris: Secondary | ICD-10-CM | POA: Insufficient documentation

## 2018-06-23 DIAGNOSIS — Z7982 Long term (current) use of aspirin: Secondary | ICD-10-CM | POA: Diagnosis not present

## 2018-06-23 DIAGNOSIS — Z950 Presence of cardiac pacemaker: Secondary | ICD-10-CM | POA: Diagnosis not present

## 2018-06-23 DIAGNOSIS — F17200 Nicotine dependence, unspecified, uncomplicated: Secondary | ICD-10-CM | POA: Diagnosis not present

## 2018-06-23 DIAGNOSIS — R531 Weakness: Secondary | ICD-10-CM | POA: Diagnosis not present

## 2018-06-23 DIAGNOSIS — R42 Dizziness and giddiness: Secondary | ICD-10-CM | POA: Insufficient documentation

## 2018-06-23 DIAGNOSIS — E1122 Type 2 diabetes mellitus with diabetic chronic kidney disease: Secondary | ICD-10-CM | POA: Diagnosis not present

## 2018-06-23 DIAGNOSIS — R197 Diarrhea, unspecified: Secondary | ICD-10-CM | POA: Diagnosis not present

## 2018-06-23 DIAGNOSIS — R1084 Generalized abdominal pain: Secondary | ICD-10-CM | POA: Diagnosis not present

## 2018-06-23 DIAGNOSIS — Z794 Long term (current) use of insulin: Secondary | ICD-10-CM | POA: Insufficient documentation

## 2018-06-23 DIAGNOSIS — R0902 Hypoxemia: Secondary | ICD-10-CM | POA: Diagnosis not present

## 2018-06-23 DIAGNOSIS — R1012 Left upper quadrant pain: Secondary | ICD-10-CM | POA: Insufficient documentation

## 2018-06-23 DIAGNOSIS — E1165 Type 2 diabetes mellitus with hyperglycemia: Secondary | ICD-10-CM | POA: Diagnosis not present

## 2018-06-23 DIAGNOSIS — R110 Nausea: Secondary | ICD-10-CM | POA: Diagnosis present

## 2018-06-23 DIAGNOSIS — Z7902 Long term (current) use of antithrombotics/antiplatelets: Secondary | ICD-10-CM | POA: Insufficient documentation

## 2018-06-23 DIAGNOSIS — R0689 Other abnormalities of breathing: Secondary | ICD-10-CM | POA: Diagnosis not present

## 2018-06-23 DIAGNOSIS — Z79899 Other long term (current) drug therapy: Secondary | ICD-10-CM | POA: Insufficient documentation

## 2018-06-23 DIAGNOSIS — N183 Chronic kidney disease, stage 3 (moderate): Secondary | ICD-10-CM | POA: Insufficient documentation

## 2018-06-23 DIAGNOSIS — K5730 Diverticulosis of large intestine without perforation or abscess without bleeding: Secondary | ICD-10-CM | POA: Diagnosis not present

## 2018-06-23 DIAGNOSIS — I10 Essential (primary) hypertension: Secondary | ICD-10-CM | POA: Diagnosis not present

## 2018-06-23 MED ORDER — ONDANSETRON HCL 4 MG/2ML IJ SOLN
4.0000 mg | Freq: Once | INTRAMUSCULAR | Status: AC
  Administered 2018-06-23: 4 mg via INTRAVENOUS
  Filled 2018-06-23: qty 2

## 2018-06-23 MED ORDER — SODIUM CHLORIDE 0.9 % IV BOLUS
500.0000 mL | Freq: Once | INTRAVENOUS | Status: AC
  Administered 2018-06-23: 500 mL via INTRAVENOUS

## 2018-06-23 MED ORDER — MECLIZINE HCL 12.5 MG PO TABS
25.0000 mg | ORAL_TABLET | Freq: Once | ORAL | Status: AC
  Administered 2018-06-23: 25 mg via ORAL
  Filled 2018-06-23: qty 2

## 2018-06-23 MED ORDER — SODIUM CHLORIDE 0.9 % IV BOLUS
1000.0000 mL | Freq: Once | INTRAVENOUS | Status: AC
  Administered 2018-06-23: 1000 mL via INTRAVENOUS

## 2018-06-23 NOTE — ED Triage Notes (Signed)
Flu like sx x 2 days N/D/

## 2018-06-23 NOTE — ED Provider Notes (Signed)
[facility] EMERGENCY DEPARTMENT Provider Note   CSN: [id number] Arrival date & time: 06/23/18  2245  Time seen 11:33 PM   History   Chief Complaint Chief Complaint  Patient presents with  . flu like sx    HPI Thomas Maldonado is a 85 y.o. male.  HPI patient states yesterday afternoon "I am so sick I cannot see".  When asked what that means he states he has severe nausea.  He denies any vomiting but states he had 3 episodes of watery diarrhea this afternoon.  He complains of pain and initially points to below his umbilicus but later states it is in the left mid abdomen.  He denies cough, rhinorrhea, sore throat, fever but does complain of chills.  He complains of a lot of dizziness with things spinning and states he has difficulty walking and has to hold onto things or he might fall.  He also complains of severe lack of energy.  He states he has a history of diverticulitis but cannot remember if this is the same symptoms.  He denies eating anything different or being around anybody else who is ill.  He did have the flu shot this season.  Patient reports he has been having vertigo symptoms off and on for a while.  He states he has had several CT scans that "do not show anything".  PCP Claretta Fraise, MD   Past Medical History:  Diagnosis Date  . Acute upper respiratory infections of unspecified site   . AV block    a. s/p [employer] PPM - [facility].  Marland Kitchen CAD (coronary artery disease)    a.  inferolateral MI 1999 s/p stent to Cx and DES to prox LAD 2013.  Marland Kitchen Chronic kidney disease, stage III (moderate) (HCC)   . Coronary atherosclerosis of unspecified type of vessel, native or graft 1999  . Diabetes mellitus without complication ([place])   . Esophageal reflux   . Hypertension   . Nausea   . NSVT (nonsustained ventricular tachycardia) ([place])   . Other chronic pain   . Problems with hearing   . Problems with sight   . Pure hypercholesterolemia   . Type II or unspecified type diabetes  mellitus with renal manifestations, not stated as uncontrolled(250.40)     Patient Active Problem List   Diagnosis Date Noted  . Elevated troponin   . Chest pain 05/25/2018  . CKD (chronic kidney disease) stage 3, GFR 30-59 ml/min (HCC) 05/25/2018  . Essential hypertension 05/08/2018  . Eosinophilia 01/10/2018  . Diabetic neuropathy with neurologic complication ([place]) 76/22/6333  . Pure hypercholesterolemia 01/09/2018  . Coronary artery disease of native heart with stable angina pectoris ([place]) 01/09/2018  . Pacemaker 01/09/2018  . Cerebral ataxia ([place]) 01/09/2018    Past Surgical History:  Procedure Laterality Date  . COLON SURGERY    . FOOT SURGERY Left 1982  . HERNIA REPAIR    . KNEE ARTHROSCOPY Right 2000  . LEFT HEART CATH AND CORONARY ANGIOGRAPHY N/A 05/26/2018   Procedure: LEFT HEART CATH AND CORONARY ANGIOGRAPHY;  Surgeon: Jettie Booze, MD;  Location: [facility];  Service: Cardiovascular;  Laterality: N/A;  . NECK SURGERY  2008   Plate in neck  . PACEMAKER INSERTION  2014  . SHOULDER SURGERY  2000  . TRANSURETHRAL PROSTATECTOMY WITH GYRUS INSTRUMENTS  1999        Home Medications    Prior to Admission medications   Medication Sig Start Date End Date Taking? Authorizing Provider  acetaminophen (  TYLENOL) 500 MG tablet Take 500-1,000 mg by mouth every 6 (six) hours as needed (FOR HEADACHES).    [provider]  aspirin 81 MG tablet Take 81 mg by mouth every evening.     [provider]  Blood Glucose Monitoring Suppl (ACCU-CHEK GUIDE) w/Device KIT 1 each by Does not apply route 2 (two) times daily. 05/05/18   Claretta Fraise, MD  celecoxib (CELEBREX) 200 MG capsule Take 1 capsule (200 mg total) by mouth daily. Patient taking differently: Take 200 mg by mouth at bedtime.  04/03/18   Claretta Fraise, MD  ezetimibe (ZETIA) 10 MG tablet Take 1 tablet (10 mg total) by mouth daily. 04/03/18   Claretta Fraise, MD  glipiZIDE (GLUCOTROL) 10 MG  tablet Take 1 tablet (10 mg total) by mouth 2 (two) times daily before a meal. 04/03/18   Claretta Fraise, MD  glucose blood test strip Use as instructed 05/05/18   Claretta Fraise, MD  insulin detemir (LEVEMIR) 100 UNIT/ML injection Inject 0.5 mLs (50 Units total) into the skin every morning. 06/06/18 09/04/18  Claretta Fraise, MD  insulin regular (HUMULIN R) 100 units/mL injection INJECT SUBCUTANEOUSLY 6 TO  10 UNITS TWO TIMES A DAY  BEFORE MEALS , PER SLIDING  SCALE UP TO 50 UNITS PER  DAY MAX Patient taking differently: Inject 6-10 Units into the skin See admin instructions. Inject 6-10 units into the skin two times a day before meals, per sliding scale- up to 50 units/day max 05/03/18   Claretta Fraise, MD  Lancets (ACCU-CHEK SOFT TOUCH) lancets Use as instructed 05/05/18   Claretta Fraise, MD  lisinopril (PRINIVIL,ZESTRIL) 10 MG tablet Take 1 tablet (10 mg total) by mouth daily. 05/26/18   Geradine Girt, DO  meclizine (ANTIVERT) 25 MG tablet Take 1 tablet (25 mg total) by mouth every 6 (six) hours as needed for dizziness or nausea. 06/24/18   Rolland Porter, MD  metoprolol tartrate (LOPRESSOR) 25 MG tablet Take 0.5 tablets (12.5 mg total) by mouth 2 (two) times daily. 06/13/18   Claretta Fraise, MD  omeprazole (PRILOSEC) 20 MG capsule Take 2 capsules (40 mg total) by mouth daily. Patient taking differently: Take 20 mg by mouth 2 (two) times daily before a meal.  04/03/18   Claretta Fraise, MD  ondansetron (ZOFRAN) 4 MG tablet Take 1 tablet (4 mg total) by mouth every 8 (eight) hours as needed for nausea or vomiting. 06/24/18   Rolland Porter, MD  oxyCODONE-acetaminophen (PERCOCET) 10-325 MG tablet Take 1 tablet by mouth every 6 (six) hours as needed for pain.  12/27/17   [provider]  pravastatin (PRAVACHOL) 20 MG tablet Take 1 tablet (20 mg total) by mouth daily. Patient taking differently: Take 20 mg by mouth at bedtime.  04/03/18   Claretta Fraise, MD  Vitamin D, Ergocalciferol, (DRISDOL) 1.25 MG (50000  UT) CAPS capsule Take 1 capsule (50,000 Units total) by mouth every Thursday. 06/01/18   Geradine Girt, DO    Family History Family History  Problem Relation Age of Onset  . Diabetes Father   . Cancer Brother   . Heart disease Brother   . Diabetes Brother   . Cancer Brother   . Heart disease Brother   . Diabetes Brother   . Cancer Brother   . Heart disease Brother   . Diabetes Brother   . Cancer Brother   . Heart disease Brother   . Cancer Brother   . Heart disease Brother   . Cancer Brother   .  Heart disease Brother   . Cancer Sister   . Heart disease Sister   . Diabetes Sister   . Cancer Sister   . Heart disease Sister   . Diabetes Sister   . Cancer Sister   . Heart disease Sister   . Cancer Sister   . Heart disease Sister     Social History Social History   Tobacco Use  . Smoking status: Former Smoker    Types: Cigarettes    Last attempt to quit: 11/02/1981    Years since quitting: 36.6  . Smokeless tobacco: Never Used  Substance Use Topics  . Alcohol use: No  . Drug use: No  lives at home Lives alone   Allergies   Metformin and related and Prednisone   Review of Systems Review of Systems  All other systems reviewed and are negative.    Physical Exam Updated Vital Signs BP (!) 138/95 (BP Location: Right Arm)   Pulse (!) 104   Temp 97.6 F (36.4 C) (Oral)   Resp (!) 22   Ht 6' (1.829 m)   Wt 101.6 kg   SpO2 95%   BMI 30.38 kg/m   Vital signs normal except for tachycardia   Physical Exam Vitals signs and nursing note reviewed.  Constitutional:      General: He is not in acute distress.    Appearance: Normal appearance. He is well-developed. He is not ill-appearing or toxic-appearing.  HENT:     Head: Normocephalic and atraumatic.     Right Ear: External ear normal.     Left Ear: External ear normal.     Nose: Nose normal. No mucosal edema or rhinorrhea.     Mouth/Throat:     Mouth: Mucous membranes are dry.     Dentition: No  dental abscesses.     Pharynx: No uvula swelling.  Eyes:     Extraocular Movements:     Right eye: No nystagmus.     Left eye: No nystagmus.     Conjunctiva/sclera: Conjunctivae normal.     Pupils: Pupils are equal, round, and reactive to light.  Neck:     Musculoskeletal: Full passive range of motion without pain, normal range of motion and neck supple.  Cardiovascular:     Rate and Rhythm: Normal rate and regular rhythm.     Heart sounds: Normal heart sounds. No murmur. No friction rub. No gallop.   Pulmonary:     Effort: Pulmonary effort is normal. No respiratory distress.     Breath sounds: Normal breath sounds. No wheezing, rhonchi or rales.  Chest:     Chest wall: No tenderness or crepitus.  Abdominal:     General: Bowel sounds are normal. There is no distension.     Palpations: Abdomen is soft.     Tenderness: There is no abdominal tenderness. There is no guarding or rebound.    Musculoskeletal: Normal range of motion.        General: No tenderness.     Comments: Moves all extremities well.   Skin:    General: Skin is warm and dry.     Coloration: Skin is not pale.     Findings: No erythema or rash.  Neurological:     Mental Status: He is alert and oriented to person, place, and time.     Cranial Nerves: No cranial nerve deficit.  Psychiatric:        Mood and Affect: Mood is not anxious.  Speech: Speech normal.        Behavior: Behavior normal.      ED Treatments / Results  Labs (all labs ordered are listed, but only abnormal results are displayed) Results for orders placed or performed during the hospital encounter of 06/23/18  Comprehensive metabolic panel  Result Value Ref Range   Sodium 140 135 - 145 mmol/L   Potassium 4.3 3.5 - 5.1 mmol/L   Chloride 109 98 - 111 mmol/L   CO2 25 22 - 32 mmol/L   Glucose, Bld 187 (H) 70 - 99 mg/dL   BUN 26 (H) 8 - 23 mg/dL   Creatinine, Ser 1.21 0.61 - 1.24 mg/dL   Calcium 8.2 (L) 8.9 - 10.3 mg/dL   Total  Protein 6.5 6.5 - 8.1 g/dL   Albumin 3.6 3.5 - 5.0 g/dL   AST 19 15 - 41 U/L   ALT 19 0 - 44 U/L   Alkaline Phosphatase 70 38 - 126 U/L   Total Bilirubin 0.4 0.3 - 1.2 mg/dL   GFR calc non Af Amer 55 (L) >60 mL/min   GFR calc Af Amer >60 >60 mL/min   Anion gap 6 5 - 15  CBC with Differential  Result Value Ref Range   WBC 7.0 4.0 - 10.5 K/uL   RBC 4.59 4.22 - 5.81 MIL/uL   Hemoglobin 13.9 13.0 - 17.0 g/dL   HCT 44.6 39.0 - 52.0 %   MCV 97.2 80.0 - 100.0 fL   MCH 30.3 26.0 - 34.0 pg   MCHC 31.2 30.0 - 36.0 g/dL   RDW 11.9 11.5 - 15.5 %   Platelets 104 (L) 150 - 400 K/uL   nRBC 0.0 0.0 - 0.2 %   Neutrophils Relative % 73 %   Neutro Abs 5.1 1.7 - 7.7 K/uL   Lymphocytes Relative 5 %   Lymphs Abs 0.3 (L) 0.7 - 4.0 K/uL   Monocytes Relative 6 %   Monocytes Absolute 0.4 0.1 - 1.0 K/uL   Eosinophils Relative 15 %   Eosinophils Absolute 1.1 (H) 0.0 - 0.5 K/uL   Basophils Relative 1 %   Basophils Absolute 0.0 0.0 - 0.1 K/uL   Immature Granulocytes 0 %   Abs Immature Granulocytes 0.01 0.00 - 0.07 K/uL   Laboratory interpretation all normal except hyperglycemia   EKG None  Radiology Ct Head Wo Contrast  Result Date: 06/24/2018 CLINICAL DATA:  Nausea, vomiting, diarrhea, and weakness for a few days. EXAM: CT HEAD WITHOUT CONTRAST TECHNIQUE: Contiguous axial images were obtained from the base of the skull through the vertex without intravenous contrast. COMPARISON:  04/03/2018 FINDINGS: Brain: No evidence of acute infarction, hemorrhage, hydrocephalus, extra-axial collection or mass lesion/mass effect. Diffuse cerebral atrophy. Ventricular dilatation consistent with central atrophy. Vascular: Vascular calcifications in the intracranial arteries. Skull: Calvarium appears intact. No acute depressed skull fractures. Sinuses/Orbits: Paranasal sinuses and mastoid air cells are not opacified. Minimal orbital calcification consistent with drusen. Other: No significant change since prior study.  IMPRESSION: No acute intracranial abnormalities. Chronic atrophy. Electronically Signed   By: Lucienne Capers M.D.   On: 06/24/2018 01:26   Ct Abdomen Pelvis W Contrast  Result Date: 06/24/2018 CLINICAL DATA:  Nausea and vomiting with diarrhea for several days. EXAM: CT ABDOMEN AND PELVIS WITH CONTRAST TECHNIQUE: Multidetector CT imaging of the abdomen and pelvis was performed using the standard protocol following bolus administration of intravenous contrast. CONTRAST:  136m ISOVUE-300 IOPAMIDOL (ISOVUE-300) INJECTION 61% COMPARISON:  10/13/2016 FINDINGS: Lower chest: The included  heart size is top normal without pericardial effusion. Right atrial and right ventricular leads are noted. Subpleural areas of coarsened interstitial lung markings and pulmonary fibrosis are demonstrated at each lung base. No effusion or dominant mass. No pulmonary consolidation. Hepatobiliary: Elevation of the right hemidiaphragm. The included liver is unremarkable. Physiologic distention of the gallbladder without stones Pancreas: Atrophic Spleen: Normal Adrenals/Urinary Tract: Adrenal glands are unremarkable. Kidneys are normal, without renal calculi, focal lesion, or hydronephrosis. Bladder is unremarkable the degree of distention. Stomach/Bowel: Small hiatal hernia. Decompressed stomach. The duodenal sweep and ligament of Treitz are normal. No small bowel obstruction or dilatation. The distal and terminal ileum are unremarkable. Normal appendix. Average stool retention within the colon with scattered col moderate colonic diverticulosis and circular muscle hypertrophy. No acute bowel obstruction or inflammation. Vascular/Lymphatic: Aortoiliac and branch vessel atherosclerosis. No lymphadenopathy. Reproductive: Normal size prostate and seminal vesicles. Other: No free air nor free fluid. Musculoskeletal: Grade 1 anterolisthesis of L4 on L5. No acute nor suspicious osseous abnormalities IMPRESSION: 1. Moderate colonic diverticulosis  without acute diverticulitis. No bowel obstruction or inflammation. 2. Aortoiliac and branch vessel atherosclerosis. 3. No acute solid or hollow visceral organ pathology. Electronically Signed   By: Ashley Royalty M.D.   On: 06/24/2018 01:31    Procedures Procedures (including critical care time)  Medications Ordered in ED Medications  sodium chloride 0.9 % bolus 1,000 mL (0 mLs Intravenous Stopped 06/24/18 0212)  sodium chloride 0.9 % bolus 500 mL (0 mLs Intravenous Stopped 06/24/18 0117)  ondansetron (ZOFRAN) injection 4 mg (4 mg Intravenous Given 06/23/18 2350)  meclizine (ANTIVERT) tablet 25 mg (25 mg Oral Given 06/23/18 2350)  iopamidol (ISOVUE-300) 61 % injection 100 mL (100 mLs Intravenous Contrast Given 06/24/18 0109)  acetaminophen (TYLENOL) tablet 650 mg (650 mg Oral Given 06/24/18 0228)     Initial Impression / Assessment and Plan / ED Course  I have reviewed the triage vital signs and the nursing notes.  Pertinent labs & imaging results that were available during my care of the patient were reviewed by me and considered in my medical decision making (see chart for details).     Patient received IV fluids, he received Zofran for his nausea and meclizine for his complaints of vertigo.  CT scans were ordered to look at his abdomen and to look at his head.  Nursing staff ambulated the patient and he walked without difficulty.  He reported feeling weak.  They noted he had a steady gait.  He has not had any diarrhea while in the ED.  At this point I think patient should be able to be discharged.  Final Clinical Impressions(s) / ED Diagnoses   Final diagnoses:  Vertigo  Diarrhea, unspecified type    ED Discharge Orders         Ordered    meclizine (ANTIVERT) 25 MG tablet  Every 6 hours PRN     06/24/18 0258    ondansetron (ZOFRAN) 4 MG tablet  Every 8 hours PRN     06/24/18 0258          Plan discharge  Rolland Porter, MD, Barbette Or, MD 06/24/18 0300

## 2018-06-23 NOTE — ED Notes (Signed)
EDP in with pt at this time.  

## 2018-06-24 ENCOUNTER — Emergency Department (HOSPITAL_COMMUNITY): Payer: Medicare Other

## 2018-06-24 DIAGNOSIS — R531 Weakness: Secondary | ICD-10-CM | POA: Diagnosis not present

## 2018-06-24 DIAGNOSIS — R42 Dizziness and giddiness: Secondary | ICD-10-CM | POA: Diagnosis not present

## 2018-06-24 DIAGNOSIS — K5730 Diverticulosis of large intestine without perforation or abscess without bleeding: Secondary | ICD-10-CM | POA: Diagnosis not present

## 2018-06-24 LAB — CBC WITH DIFFERENTIAL/PLATELET
Abs Immature Granulocytes: 0.01 10*3/uL (ref 0.00–0.07)
Basophils Absolute: 0 10*3/uL (ref 0.0–0.1)
Basophils Relative: 1 %
EOS ABS: 1.1 10*3/uL — AB (ref 0.0–0.5)
Eosinophils Relative: 15 %
HCT: 44.6 % (ref 39.0–52.0)
Hemoglobin: 13.9 g/dL (ref 13.0–17.0)
IMMATURE GRANULOCYTES: 0 %
Lymphocytes Relative: 5 %
Lymphs Abs: 0.3 10*3/uL — ABNORMAL LOW (ref 0.7–4.0)
MCH: 30.3 pg (ref 26.0–34.0)
MCHC: 31.2 g/dL (ref 30.0–36.0)
MCV: 97.2 fL (ref 80.0–100.0)
Monocytes Absolute: 0.4 10*3/uL (ref 0.1–1.0)
Monocytes Relative: 6 %
Neutro Abs: 5.1 10*3/uL (ref 1.7–7.7)
Neutrophils Relative %: 73 %
Platelets: 104 10*3/uL — ABNORMAL LOW (ref 150–400)
RBC: 4.59 MIL/uL (ref 4.22–5.81)
RDW: 11.9 % (ref 11.5–15.5)
WBC: 7 10*3/uL (ref 4.0–10.5)
nRBC: 0 % (ref 0.0–0.2)

## 2018-06-24 LAB — COMPREHENSIVE METABOLIC PANEL
ALT: 19 U/L (ref 0–44)
AST: 19 U/L (ref 15–41)
Albumin: 3.6 g/dL (ref 3.5–5.0)
Alkaline Phosphatase: 70 U/L (ref 38–126)
Anion gap: 6 (ref 5–15)
BUN: 26 mg/dL — ABNORMAL HIGH (ref 8–23)
CO2: 25 mmol/L (ref 22–32)
Calcium: 8.2 mg/dL — ABNORMAL LOW (ref 8.9–10.3)
Chloride: 109 mmol/L (ref 98–111)
Creatinine, Ser: 1.21 mg/dL (ref 0.61–1.24)
GFR calc Af Amer: >60 mL/min (ref >60)
GFR calc non Af Amer: 55 mL/min — ABNORMAL LOW (ref >60)
Glucose, Bld: 187 mg/dL — ABNORMAL HIGH (ref 70–99)
Potassium: 4.3 mmol/L (ref 3.5–5.1)
Sodium: 140 mmol/L (ref 135–145)
Total Bilirubin: 0.4 mg/dL (ref 0.3–1.2)
Total Protein: 6.5 g/dL (ref 6.5–8.1)

## 2018-06-24 MED ORDER — ACETAMINOPHEN 325 MG PO TABS
ORAL_TABLET | ORAL | Status: AC
  Administered 2018-06-24: 650 mg via ORAL
  Filled 2018-06-24: qty 2

## 2018-06-24 MED ORDER — ONDANSETRON HCL 4 MG PO TABS: 4.0000 mg | ORAL_TABLET | Freq: Three times a day (TID) | ORAL | 0 refills | Status: DC | PRN

## 2018-06-24 MED ORDER — MECLIZINE HCL 25 MG PO TABS: 25.0000 mg | ORAL_TABLET | Freq: Four times a day (QID) | ORAL | 0 refills | Status: AC | PRN

## 2018-06-24 MED ORDER — IOPAMIDOL (ISOVUE-300) INJECTION 61%
100.0000 mL | Freq: Once | INTRAVENOUS | Status: AC | PRN
  Administered 2018-06-24: 100 mL via INTRAVENOUS

## 2018-06-24 MED ORDER — ACETAMINOPHEN 325 MG PO TABS
650.0000 mg | ORAL_TABLET | Freq: Once | ORAL | Status: AC
  Administered 2018-06-24: 650 mg via ORAL

## 2018-06-24 NOTE — Discharge Instructions (Addendum)
Drink plenty of fluids (clear liquids) then start a bland diet later this morning such as toast, crackers, jello, [employer] chicken noodle soup. Use the zofran for nausea or vomiting. Take imodium OTC for diarrhea. Avoid milk products until the diarrhea is gone.  Take the meclizine/Antivert for your feeling of spinning or falling.  Recheck if you get dehydrated.

## 2018-06-24 NOTE — ED Notes (Signed)
Ambulated pt in hallway without difficulty. Pt does report he still feels weak. Steady gate noted. Pt has not had any diarrhea since being in ED. Pt given med for headache just prior to walking.

## 2018-06-24 NOTE — ED Notes (Addendum)
Patient transported to CT 

## 2018-06-28 ENCOUNTER — Ambulatory Visit: Payer: Medicare Other | Admitting: Family Medicine

## 2018-06-29 ENCOUNTER — Ambulatory Visit (INDEPENDENT_AMBULATORY_CARE_PROVIDER_SITE_OTHER): Payer: Medicare Other | Admitting: Family Medicine

## 2018-06-29 ENCOUNTER — Encounter: Payer: Self-pay | Admitting: Family Medicine

## 2018-06-29 VITALS — BP 153/78 | HR 81 | Temp 97.3°F | Ht 72.0 in | Wt 227.0 lb

## 2018-06-29 DIAGNOSIS — R1010 Upper abdominal pain, unspecified: Secondary | ICD-10-CM | POA: Diagnosis not present

## 2018-06-29 DIAGNOSIS — R110 Nausea: Secondary | ICD-10-CM | POA: Diagnosis not present

## 2018-06-29 LAB — CMP14+EGFR
ALK PHOS: 92 IU/L (ref 39–117)
ALT: 23 IU/L (ref 0–44)
AST: 26 IU/L (ref 0–40)
Albumin/Globulin Ratio: 1.5 (ref 1.2–2.2)
Albumin: 3.9 g/dL (ref 3.6–4.6)
BUN/Creatinine Ratio: 12 (ref 10–24)
BUN: 16 mg/dL (ref 8–27)
Bilirubin Total: 0.2 mg/dL (ref 0.0–1.2)
CO2: 25 mmol/L (ref 20–29)
Calcium: 9.1 mg/dL (ref 8.6–10.2)
Chloride: 104 mmol/L (ref 96–106)
Creatinine, Ser: 1.35 mg/dL — ABNORMAL HIGH (ref 0.76–1.27)
GFR calc Af Amer: 56 mL/min/{1.73_m2} — ABNORMAL LOW (ref >59)
GFR calc non Af Amer: 49 mL/min/{1.73_m2} — ABNORMAL LOW (ref >59)
GLUCOSE: 108 mg/dL — AB (ref 65–99)
Globulin, Total: 2.6 g/dL (ref 1.5–4.5)
Potassium: 4.4 mmol/L (ref 3.5–5.2)
Sodium: 143 mmol/L (ref 134–144)
Total Protein: 6.5 g/dL (ref 6.0–8.5)

## 2018-06-29 LAB — CBC
Hematocrit: 40.9 % (ref 37.5–51.0)
Hemoglobin: 13.6 g/dL (ref 13.0–17.7)
MCH: 31.1 pg (ref 26.6–33.0)
MCHC: 33.3 g/dL (ref 31.5–35.7)
MCV: 94 fL (ref 79–97)
PLATELETS: 146 10*3/uL — AB (ref 150–450)
RBC: 4.37 x10E6/uL (ref 4.14–5.80)
RDW: 12.5 % (ref 11.6–15.4)
WBC: 9.2 10*3/uL (ref 3.4–10.8)

## 2018-06-29 LAB — LIPASE: Lipase: 18 U/L (ref 13–78)

## 2018-06-29 MED ORDER — ONDANSETRON HCL 4 MG PO TABS: 4.0000 mg | ORAL_TABLET | Freq: Three times a day (TID) | ORAL | 0 refills | Status: DC | PRN

## 2018-06-29 NOTE — Progress Notes (Signed)
Subjective: CC: "sick on stomach" PCP: Claretta Fraise, MD NOI:[id number] Thomas Maldonado is a 85 y.o. male presenting to clinic today for:  1. Stomachache Patient reports that he has been nauseated with mild stomach ache and associated headache since Friday.  He was seen in the emergency department on Friday night because he was having associated diarrhea.  Diarrhea has since resolved and he is having normal bowel movements.  He notes his bowel movements are "the best he is ever had in the last 5 years".  No constipation, hematochezia or melena.  No fevers.  Abdominal discomfort is generalized.  He has not vomited but just feels nauseated, this is relieved by Zofran.  He is also had some dizziness and feels fatigued.  He reports multiple sick contacts with viral gastroenteritis is at home but there seems to be improving while his has not, which was why he brings himself into the office today.  Additionally, he is tolerating fluids without difficulty and eating bland foods.   ROS: Per HPI  Allergies  Allergen Reactions  . Metformin And Related Nausea Only  . Prednisone Anxiety    Pt reports "with all steriods gets anxious, tremors, and tears me all to pieces"    Past Medical History:  Diagnosis Date  . Acute upper respiratory infections of unspecified site   . AV block    a. s/p Medtr[employer] PPM - [facility].  Marland Kitchen CAD (coronary artery disease)    a.  inferolateral MI 1999 s/p stent to Cx and DES to prox LAD 2013.  Marland Kitchen Chronic kidney disease, stage III (moderate) (HCC)   . Coronary atherosclerosis of unspecified type of vessel, native or graft 1999  . Diabetes mellitus without complication ([place])   . Esophageal reflux   . Hypertension   . Nausea   . NSVT (nonsustained ventricular tachycardia) ([place])   . Other chronic pain   . Problems with hearing   . Problems with sight   . Pure hypercholesterolemia   . Type II or unspecified type diabetes mellitus with renal manifestations, not stated as  uncontrolled(250.40)     Current Outpatient Medications:  .  acetaminophen (TYLENOL) 500 MG tablet, Take 500-1,000 mg by mouth every 6 (six) hours as needed (FOR HEADACHES)., Disp: , Rfl:  .  aspirin 81 MG tablet, Take 81 mg by mouth every evening. , Disp: , Rfl:  .  Blood Glucose Monitoring Suppl (ACCU-CHEK GUIDE) w/Device KIT, 1 each by Does not apply route 2 (two) times daily., Disp: 1 kit, Rfl: 0 .  celecoxib (CELEBREX) 200 MG capsule, Take 1 capsule (200 mg total) by mouth daily. (Patient taking differently: Take 200 mg by mouth at bedtime. ), Disp: 90 capsule, Rfl: 1 .  ezetimibe (ZETIA) 10 MG tablet, Take 1 tablet (10 mg total) by mouth daily., Disp: 90 tablet, Rfl: 1 .  glipiZIDE (GLUCOTROL) 10 MG tablet, Take 1 tablet (10 mg total) by mouth 2 (two) times daily before a meal., Disp: 180 tablet, Rfl: 1 .  glucose blood test strip, Use as instructed, Disp: 100 each, Rfl: 12 .  insulin detemir (LEVEMIR) 100 UNIT/ML injection, Inject 0.5 mLs (50 Units total) into the skin every morning., Disp: 45 mL, Rfl: 0 .  insulin regular (HUMULIN R) 100 units/mL injection, INJECT SUBCUTANEOUSLY 6 TO  10 UNITS TWO TIMES A DAY  BEFORE MEALS , PER SLIDING  SCALE UP TO 50 UNITS PER  DAY MAX (Patient taking differently: Inject 6-10 Units into the skin See admin instructions. Inject 6-10  units into the skin two times a day before meals, per sliding scale- up to 50 units/day max), Disp: 30 mL, Rfl: 1 .  Lancets (ACCU-CHEK SOFT TOUCH) lancets, Use as instructed, Disp: 100 each, Rfl: 12 .  lisinopril (PRINIVIL,ZESTRIL) 10 MG tablet, Take 1 tablet (10 mg total) by mouth daily., Disp: , Rfl:  .  meclizine (ANTIVERT) 25 MG tablet, Take 1 tablet (25 mg total) by mouth every 6 (six) hours as needed for dizziness or nausea., Disp: 30 tablet, Rfl: 0 .  metoprolol tartrate (LOPRESSOR) 25 MG tablet, Take 0.5 tablets (12.5 mg total) by mouth 2 (two) times daily., Disp: 60 tablet, Rfl: 0 .  omeprazole (PRILOSEC) 20 MG capsule,  Take 2 capsules (40 mg total) by mouth daily. (Patient taking differently: Take 20 mg by mouth 2 (two) times daily before a meal. ), Disp: 180 capsule, Rfl: 1 .  ondansetron (ZOFRAN) 4 MG tablet, Take 1 tablet (4 mg total) by mouth every 8 (eight) hours as needed for nausea or vomiting., Disp: 10 tablet, Rfl: 0 .  oxyCODONE-acetaminophen (PERCOCET) 10-325 MG tablet, Take 1 tablet by mouth every 6 (six) hours as needed for pain. , Disp: , Rfl: 0 .  pravastatin (PRAVACHOL) 20 MG tablet, Take 1 tablet (20 mg total) by mouth daily. (Patient taking differently: Take 20 mg by mouth at bedtime. ), Disp: 90 tablet, Rfl: 1 .  Vitamin D, Ergocalciferol, (DRISDOL) 1.25 MG (50000 UT) CAPS capsule, Take 1 capsule (50,000 Units total) by mouth every Thursday., Disp: , Rfl:  Social History   Socioeconomic History  . Marital status: Divorced    Spouse name: Not on file  . Number of children: Not on file  . Years of education: 6  . Highest education level: Not on file  Occupational History  . Occupation: retired  Scientific laboratory technician  . Financial resource strain: Not on file  . Food insecurity:    Worry: Not on file    Inability: Not on file  . Transportation needs:    Medical: Not on file    Non-medical: Not on file  Tobacco Use  . Smoking status: Former Smoker    Types: Cigarettes    Last attempt to quit: 11/02/1981    Years since quitting: 36.6  . Smokeless tobacco: Never Used  Substance and Sexual Activity  . Alcohol use: No  . Drug use: No  . Sexual activity: Not Currently  Lifestyle  . Physical activity:    Days per week: Not on file    Minutes per session: Not on file  . Stress: Not on file  Relationships  . Social connections:    Talks on phone: Not on file    Gets together: Not on file    Attends religious service: Not on file    Active member of club or organization: Not on file    Attends meetings of clubs or organizations: Not on file    Relationship status: Not on file  . Intimate  partner violence:    Fear of current or ex partner: Not on file    Emotionally abused: Not on file    Physically abused: Not on file    Forced sexual activity: Not on file  Other Topics Concern  . Not on file  Social History Narrative  . Not on file   Family History  Problem Relation Age of Onset  . Diabetes Father   . Cancer Brother   . Heart disease Brother   . Diabetes Brother   .  Cancer Brother   . Heart disease Brother   . Diabetes Brother   . Cancer Brother   . Heart disease Brother   . Diabetes Brother   . Cancer Brother   . Heart disease Brother   . Cancer Brother   . Heart disease Brother   . Cancer Brother   . Heart disease Brother   . Cancer Sister   . Heart disease Sister   . Diabetes Sister   . Cancer Sister   . Heart disease Sister   . Diabetes Sister   . Cancer Sister   . Heart disease Sister   . Cancer Sister   . Heart disease Sister     Objective: Office vital signs reviewed. BP (!) 153/78   Pulse 81   Temp (!) 97.3 F (36.3 C) (Oral)   Ht 6' (1.829 m)   Wt 227 lb (103 kg)   BMI 30.79 kg/m   Physical Examination:  General: Awake, alert, well nourished, No acute distress HEENT: sclera white, MMM GI: soft, mild TTP to epigastrium RUQ and LUQ, non-distended, bowel sounds present x4, no hepatomegaly, no splenomegaly, no masses. No peritoneal signs  Assessment/ Plan: 85 y.o. male   1. Pain of upper abdomen Blood pressure slightly elevated but otherwise vital signs are stable.  No evidence of dehydration.  Likely related to viral gastroenteritis.  However, given fluctuating blood sugars at home, epigastric tenderness to palpation/right upper quadrant and left upper quadrant tenderness we will further evaluate with repeat labs and add lipase.  I reviewed his emergency department note including CT scan results which demonstrated no abnormalities within the pancreas besides atrophy.  Diverticulosis without diverticulitis appreciated.  In the absence  of diarrhea, I would be shocked if he is having a diverticulitis episode.  We will contact patient with results once available and plan treatment further.  We discussed reasons for repeat emergent evaluation and he voiced good understanding.  Hydration encouraged.  Zofran refilled. - CBC - CMP14+EGFR - Lipase  2. Nausea As above.  NO evidence of dehydration. - CBC - CMP14+EGFR - Lipase   Orders Placed This Encounter  Procedures  . CBC  . CMP14+EGFR  . Lipase   Meds ordered this encounter  Medications  . ondansetron (ZOFRAN) 4 MG tablet    Sig: Take 1 tablet (4 mg total) by mouth every 8 (eight) hours as needed for nausea or vomiting.    Dispense:  10 tablet    Refill:  0     Gaspare Netzel Windell Moulding, DO [place] [phone]

## 2018-06-29 NOTE — Patient Instructions (Addendum)
I reviewed your imaging studies which did not demonstrate anything to explain your abdominal pain and nausea.  There was no evidence of infection on that study.  This may be viral, particularly since other people in your family are having similar symptoms.  However, I am going to check labs for pancreatitis to make sure that this is not causing your symptoms.  You will be contacted with the results in the next 24 hours.  I have renewed your nausea medicine.  Please make sure that you are hydrating well with water.   Viral Gastroenteritis, Adult  Viral gastroenteritis is also known as the stomach flu. This condition is caused by certain germs (viruses). These germs can be passed from person to person very easily (are very contagious). This condition can cause sudden watery poop (diarrhea), fever, and throwing up (vomiting). Having watery poop and throwing up can make you feel weak and cause you to get dehydrated. Dehydration can make you tired and thirsty, make you have a dry mouth, and make it so you pee (urinate) less often. Older adults and people with other diseases or a weak defense system (immune system) are at higher risk for dehydration. It is important to replace the fluids that you lose from having watery poop and throwing up. Follow these instructions at home: Follow instructions from your doctor about how to care for yourself at home. Eating and drinking Follow these instructions as told by your doctor:  Take an oral rehydration solution (ORS). This is a drink that is sold at pharmacies and stores.  Drink clear fluids in small amounts as you are able, such as: ? Water. ? Ice chips. ? Diluted fruit juice. ? Low-calorie sports drinks.  Eat bland, easy-to-digest foods in small amounts as you are able, such as: ? Bananas. ? Applesauce. ? Rice. ? Low-fat (lean) meats. ? Toast. ? Crackers.  Avoid fluids that have a lot of sugar or caffeine in them.  Avoid alcohol.  Avoid  spicy or fatty foods. General instructions   Drink enough fluid to keep your pee (urine) clear or pale yellow.  Wash your hands often. If you cannot use soap and water, use hand sanitizer.  Make sure that all people in your home wash their hands well and often.  Rest at home while you get better.  Take over-the-counter and prescription medicines only as told by your doctor.  Watch your condition for any changes.  Take a warm bath to help with any burning or pain from having watery poop.  Keep all follow-up visits as told by your doctor. This is important. Contact a doctor if:  You cannot keep fluids down.  Your symptoms get worse.  You have new symptoms.  You feel light-headed or dizzy.  You have muscle cramps. Get help right away if:  You have chest pain.  You feel very weak or you pass out (faint).  You see blood in your throw-up.  Your throw-up looks like coffee grounds.  You have bloody or black poop (stools) or poop that look like tar.  You have a very bad headache, a stiff neck, or both.  You have a rash.  You have very bad pain, cramping, or bloating in your belly (abdomen).  You have trouble breathing.  You are breathing very quickly.  Your heart is beating very quickly.  Your skin feels cold and clammy.  You feel confused.  You have pain when you pee.  You have signs of dehydration, such as: ?  Dark pee, hardly any pee, or no pee. ? Cracked lips. ? Dry mouth. ? Sunken eyes. ? Sleepiness. ? Weakness. This information is not intended to replace advice given to you by your health care provider. Make sure you discuss any questions you have with your health care provider. Document Released: 10/27/2007 Document Revised: 02/01/2018 Document Reviewed: 01/14/2015 Elsevier Interactive Patient Education  2019 [employer].

## 2018-07-01 ENCOUNTER — Other Ambulatory Visit: Payer: Self-pay | Admitting: Family Medicine

## 2018-07-05 ENCOUNTER — Other Ambulatory Visit: Payer: Self-pay | Admitting: Family Medicine

## 2018-07-05 ENCOUNTER — Telehealth: Payer: Self-pay | Admitting: *Deleted

## 2018-07-05 MED ORDER — INSULIN DETEMIR 100 UNIT/ML FLEXPEN: 50.0000 [IU] | PEN_INJECTOR | Freq: Every day | SUBCUTANEOUS | 3 refills | Status: DC

## 2018-07-05 NOTE — Telephone Encounter (Signed)
Fax from [facility] request for Levemir flexpen, draws up too much medication when using vials Please advise

## 2018-07-05 NOTE — Telephone Encounter (Signed)
Please make the change as patient requests - same sig, etc. Thanks, WS

## 2018-07-05 NOTE — Telephone Encounter (Signed)
Requested Rx sent to pharmacy per Dr. Livia Snellen

## 2018-07-14 DIAGNOSIS — H5213 Myopia, bilateral: Secondary | ICD-10-CM | POA: Diagnosis not present

## 2018-07-14 DIAGNOSIS — E113293 Type 2 diabetes mellitus with mild nonproliferative diabetic retinopathy without macular edema, bilateral: Secondary | ICD-10-CM | POA: Diagnosis not present

## 2018-07-14 DIAGNOSIS — Z794 Long term (current) use of insulin: Secondary | ICD-10-CM | POA: Diagnosis not present

## 2018-07-14 DIAGNOSIS — Z7984 Long term (current) use of oral hypoglycemic drugs: Secondary | ICD-10-CM | POA: Diagnosis not present

## 2018-07-14 DIAGNOSIS — H04123 Dry eye syndrome of bilateral lacrimal glands: Secondary | ICD-10-CM | POA: Diagnosis not present

## 2018-07-14 LAB — HM DIABETES EYE EXAM

## 2018-07-24 DIAGNOSIS — M47812 Spondylosis without myelopathy or radiculopathy, cervical region: Secondary | ICD-10-CM | POA: Diagnosis not present

## 2018-07-24 DIAGNOSIS — M479 Spondylosis, unspecified: Secondary | ICD-10-CM | POA: Diagnosis not present

## 2018-07-24 DIAGNOSIS — Z79899 Other long term (current) drug therapy: Secondary | ICD-10-CM | POA: Diagnosis not present

## 2018-07-24 DIAGNOSIS — I252 Old myocardial infarction: Secondary | ICD-10-CM | POA: Diagnosis not present

## 2018-07-31 ENCOUNTER — Other Ambulatory Visit: Payer: Self-pay | Admitting: Family Medicine

## 2018-08-02 ENCOUNTER — Encounter: Payer: Self-pay | Admitting: Family Medicine

## 2018-08-02 ENCOUNTER — Other Ambulatory Visit: Payer: Self-pay

## 2018-08-02 ENCOUNTER — Ambulatory Visit (INDEPENDENT_AMBULATORY_CARE_PROVIDER_SITE_OTHER): Payer: Medicare Other | Admitting: Family Medicine

## 2018-08-02 VITALS — BP 137/79 | HR 78 | Temp 97.0°F | Ht 72.0 in | Wt 224.4 lb

## 2018-08-02 DIAGNOSIS — E1140 Type 2 diabetes mellitus with diabetic neuropathy, unspecified: Secondary | ICD-10-CM

## 2018-08-02 DIAGNOSIS — E7800 Pure hypercholesterolemia, unspecified: Secondary | ICD-10-CM | POA: Diagnosis not present

## 2018-08-02 DIAGNOSIS — E1149 Type 2 diabetes mellitus with other diabetic neurological complication: Secondary | ICD-10-CM

## 2018-08-02 LAB — BAYER DCA HB A1C WAIVED: HB A1C (BAYER DCA - WAIVED): 7.6 % — ABNORMAL HIGH (ref <7.0)

## 2018-08-02 MED ORDER — CELECOXIB 200 MG PO CAPS: 200.0000 mg | ORAL_CAPSULE | Freq: Every day | ORAL | 1 refills | Status: DC

## 2018-08-02 MED ORDER — EZETIMIBE 10 MG PO TABS: 10.0000 mg | ORAL_TABLET | Freq: Every day | ORAL | 1 refills | Status: DC

## 2018-08-02 MED ORDER — METOPROLOL TARTRATE 25 MG PO TABS: 12.5000 mg | ORAL_TABLET | Freq: Two times a day (BID) | ORAL | 1 refills | Status: DC

## 2018-08-02 MED ORDER — GLIPIZIDE 10 MG PO TABS: ORAL_TABLET | ORAL | 1 refills | Status: DC

## 2018-08-02 MED ORDER — ONDANSETRON 4 MG PO TBDP: 4.0000 mg | ORAL_TABLET | Freq: Three times a day (TID) | ORAL | 2 refills | Status: AC | PRN

## 2018-08-02 NOTE — Progress Notes (Signed)
Subjective:  Patient ID: Thomas Maldonado,  male    DOB: 31-May-1933  Age: 85 y.o.    CC: Medical Management of Chronic Issues   HPI Thomas Maldonado presents for  follow-up of hypertension. Patient has no history of headache chest pain or shortness of breath or recent cough. Patient also denies symptoms of TIA such as numbness weakness lateralizing. Patient denies side effects from medication. States taking it regularly.  Patient also  in for follow-up of elevated cholesterol. Doing well without complaints on current medication. Denies side effects  including myalgia and arthralgia and nausea. Also in today for liver function testing. Currently no chest pain, shortness of breath or other cardiovascular related symptoms noted.  Follow-up of diabetes. Patient does not check blood sugar at home.Patient denies symptoms such as excessive hunger or urinary frequency, excessive hunger, nausea No significant hypoglycemic spells noted. Medications reviewed. Pt reports taking them regularly. Pt. denies complication/adverse reaction today.    History Thomas Maldonado has a past medical history of Acute upper respiratory infections of unspecified site, AV block, CAD (coronary artery disease), Chronic kidney disease, stage III (moderate) ([place]), Coronary atherosclerosis of unspecified type of vessel, native or graft (1999), Diabetes mellitus without complication ([place]), Esophageal reflux, Hypertension, Nausea, NSVT (nonsustained ventricular tachycardia) ([place]), Other chronic pain, Problems with hearing, Problems with sight, Pure hypercholesterolemia, and Type II or unspecified type diabetes mellitus with renal manifestations, not stated as uncontrolled(250.40).   Thomas Maldonado has a past surgical history that includes Neck surgery (2008); Shoulder surgery (2000); Knee arthroscopy (Right, 2000); Transurethral prostatectomy with gyrus instruments (1999); Foot surgery (Left, 1982); Hernia repair; Colon surgery; Pacemaker insertion (2014);  and LEFT HEART CATH AND CORONARY ANGIOGRAPHY (N/A, 05/26/2018).   His family history includes Cancer in his brother, brother, brother, brother, brother, brother, sister, sister, sister, and sister; Diabetes in his brother, brother, brother, father, sister, and sister; Heart disease in his brother, brother, brother, brother, brother, brother, sister, sister, sister, and sister.Thomas Maldonado reports that Thomas Maldonado quit smoking about 36 years ago. His smoking use included cigarettes. Thomas Maldonado has never used smokeless tobacco. Thomas Maldonado reports that Thomas Maldonado does not drink alcohol or use drugs.  Current Outpatient Medications on File Prior to Visit  Medication Sig Dispense Refill  . acetaminophen (TYLENOL) 500 MG tablet Take 500-1,000 mg by mouth every 6 (six) hours as needed (FOR HEADACHES).    Marland Kitchen aspirin 81 MG tablet Take 81 mg by mouth every evening.     . Blood Glucose Monitoring Suppl (ACCU-CHEK GUIDE) w/Device KIT 1 each by Does not apply route 2 (two) times daily. 1 kit 0  . glucose blood test strip Use as instructed 100 each 12  . Insulin Detemir (LEVEMIR FLEXTOUCH) 100 UNIT/ML Pen Inject 50 Units into the skin daily. 15 mL 3  . insulin regular (HUMULIN R) 100 units/mL injection INJECT SUBCUTANEOUSLY 6 TO  10 UNITS TWO TIMES A DAY  BEFORE MEALS , PER SLIDING  SCALE UP TO 50 UNITS PER  DAY MAX (Patient taking differently: Inject 6-10 Units into the skin See admin instructions. Inject 6-10 units into the skin two times a day before meals, per sliding scale- up to 50 units/day max) 30 mL 1  . Lancets (ACCU-CHEK SOFT TOUCH) lancets Use as instructed 100 each 12  . lisinopril (PRINIVIL,ZESTRIL) 10 MG tablet Take 1 tablet (10 mg total) by mouth daily.    . meclizine (ANTIVERT) 25 MG tablet Take 1 tablet (25 mg total) by mouth every 6 (six) hours as needed for dizziness or  nausea. 30 tablet 0  . omeprazole (PRILOSEC) 20 MG capsule TAKE 2 CAPSULES BY MOUTH  DAILY 180 capsule 1  . ondansetron (ZOFRAN) 4 MG tablet Take 1 tablet (4 mg total) by  mouth every 8 (eight) hours as needed for nausea or vomiting. 10 tablet 0  . oxyCODONE-acetaminophen (PERCOCET) 10-325 MG tablet Take 1 tablet by mouth every 6 (six) hours as needed for pain.   0  . pravastatin (PRAVACHOL) 20 MG tablet Take 1 tablet (20 mg total) by mouth daily. (Patient taking differently: Take 20 mg by mouth at bedtime. ) 90 tablet 1  . Vitamin D, Ergocalciferol, (DRISDOL) 1.25 MG (50000 UT) CAPS capsule Take 1 capsule (50,000 Units total) by mouth every Thursday.     No current facility-administered medications on file prior to visit.     ROS Review of Systems  Constitutional: Negative.   HENT: Negative.   Eyes: Negative for visual disturbance.  Respiratory: Negative for cough and shortness of breath.   Cardiovascular: Negative for chest pain and leg swelling.  Gastrointestinal: Positive for nausea. Negative for abdominal pain, diarrhea and vomiting.  Genitourinary: Negative for difficulty urinating.  Musculoskeletal: Negative for arthralgias and myalgias.  Skin: Negative for rash.  Neurological: Negative for headaches.  Psychiatric/Behavioral: Negative for sleep disturbance.    Objective:  BP 137/79   Pulse 78   Temp (!) 97 F (36.1 C) (Oral)   Ht 6' (1.829 m)   Wt 224 lb 6 oz (101.8 kg)   BMI 30.43 kg/m   BP Readings from Last 3 Encounters:  08/02/18 137/79  06/29/18 (!) 153/78  06/24/18 105/68    Wt Readings from Last 3 Encounters:  08/02/18 224 lb 6 oz (101.8 kg)  06/29/18 227 lb (103 kg)  06/23/18 224 lb (101.6 kg)     Physical Exam Constitutional:      General: Thomas Maldonado is not in acute distress.    Appearance: Thomas Maldonado is well-developed.  HENT:     Head: Normocephalic and atraumatic.     Right Ear: External ear normal.     Left Ear: External ear normal.     Nose: Nose normal.  Eyes:     Conjunctiva/sclera: Conjunctivae normal.     Pupils: Pupils are equal, round, and reactive to light.  Neck:     Musculoskeletal: Normal range of motion and neck  supple.  Cardiovascular:     Rate and Rhythm: Normal rate and regular rhythm.     Heart sounds: Normal heart sounds. No murmur.  Pulmonary:     Effort: Pulmonary effort is normal. No respiratory distress.     Breath sounds: Normal breath sounds. No wheezing or rales.  Abdominal:     Palpations: Abdomen is soft.     Tenderness: There is no abdominal tenderness.  Musculoskeletal: Normal range of motion.  Skin:    General: Skin is warm and dry.  Neurological:     Mental Status: Thomas Maldonado is alert and oriented to person, place, and time.     Deep Tendon Reflexes: Reflexes are normal and symmetric.  Psychiatric:        Behavior: Behavior normal.        Thought Content: Thought content normal.        Judgment: Judgment normal.     Diabetic Foot Exam - Simple   Simple Foot Form Diabetic Foot exam was performed with the following findings:  Yes 08/02/2018 11:00 AM  Visual Inspection No deformities, no ulcerations, no other skin breakdown bilaterally:  Yes  Sensation Testing Intact to touch and monofilament testing bilaterally:  Yes Pulse Check Posterior Tibialis and Dorsalis pulse intact bilaterally:  Yes Comments       Assessment & Plan:   Thomas Maldonado was seen today for medical management of chronic issues.  Diagnoses and all orders for this visit:  Pure hypercholesterolemia -     CBC with Differential/Platelet -     CMP14+EGFR -     Lipid panel  Diabetic neuropathy with neurologic complication (HCC) -     Bayer DCA Hb A1c Waived  Other orders -     celecoxib (CELEBREX) 200 MG capsule; Take 1 capsule (200 mg total) by mouth daily. -     ezetimibe (ZETIA) 10 MG tablet; Take 1 tablet (10 mg total) by mouth daily. -     glipiZIDE (GLUCOTROL) 10 MG tablet; TAKE 1 TABLET BY MOUTH 2  TIMES DAILY BEFORE A MEAL -     metoprolol tartrate (LOPRESSOR) 25 MG tablet; Take 0.5 tablets (12.5 mg total) by mouth 2 (two) times daily. -     ondansetron (ZOFRAN-ODT) 4 MG disintegrating tablet; Take 1  tablet (4 mg total) by mouth every 8 (eight) hours as needed for nausea or vomiting.   I have changed Jeralyn Ruths Santacroce's celecoxib and ezetimibe. I am also having him start on ondansetron. Additionally, I am having him maintain his aspirin, oxyCODONE-acetaminophen, pravastatin, insulin regular, Accu-Chek Guide, glucose blood, accu-chek soft touch, acetaminophen, lisinopril, Vitamin D (Ergocalciferol), meclizine, ondansetron, Insulin Detemir, omeprazole, glipiZIDE, and metoprolol tartrate.  Meds ordered this encounter  Medications  . celecoxib (CELEBREX) 200 MG capsule    Sig: Take 1 capsule (200 mg total) by mouth daily.    Dispense:  90 capsule    Refill:  1  . ezetimibe (ZETIA) 10 MG tablet    Sig: Take 1 tablet (10 mg total) by mouth daily.    Dispense:  90 tablet    Refill:  1  . glipiZIDE (GLUCOTROL) 10 MG tablet    Sig: TAKE 1 TABLET BY MOUTH 2  TIMES DAILY BEFORE A MEAL    Dispense:  180 tablet    Refill:  1  . metoprolol tartrate (LOPRESSOR) 25 MG tablet    Sig: Take 0.5 tablets (12.5 mg total) by mouth 2 (two) times daily.    Dispense:  180 tablet    Refill:  1  . ondansetron (ZOFRAN-ODT) 4 MG disintegrating tablet    Sig: Take 1 tablet (4 mg total) by mouth every 8 (eight) hours as needed for nausea or vomiting.    Dispense:  90 tablet    Refill:  2     Follow-up: No follow-ups on file.  Claretta Fraise, M.D.

## 2018-08-02 NOTE — Patient Instructions (Signed)
Carbohydrate Counting for Diabetes Mellitus, Adult  Carbohydrate counting is a method of keeping track of how many carbohydrates you eat. Eating carbohydrates naturally increases the amount of sugar (glucose) in the blood. Counting how many carbohydrates you eat helps keep your blood glucose within normal limits, which helps you manage your diabetes (diabetes mellitus). It is important to know how many carbohydrates you can safely have in each meal. This is different for every person. A diet and nutrition specialist (registered dietitian) can help you make a meal plan and calculate how many carbohydrates you should have at each meal and snack. Carbohydrates are found in the following foods:  Grains, such as breads and cereals.  Dried beans and soy products.  Starchy vegetables, such as potatoes, peas, and corn.  Fruit and fruit juices.  Milk and yogurt.  Sweets and snack foods, such as cake, cookies, candy, chips, and soft drinks. How do I count carbohydrates? There are two ways to count carbohydrates in food. You can use either of the methods or a combination of both. Reading "Nutrition Facts" on packaged food The "Nutrition Facts" list is included on the labels of almost all packaged foods and beverages in the [place] It includes:  The serving size.  Information about nutrients in each serving, including the grams (g) of carbohydrate per serving. To use the "Nutrition Facts":  Decide how many servings you will have.  Multiply the number of servings by the number of carbohydrates per serving.  The resulting number is the total amount of carbohydrates that you will be having. Learning standard serving sizes of other foods When you eat carbohydrate foods that are not packaged or do not include "Nutrition Facts" on the label, you need to measure the servings in order to count the amount of carbohydrates:  Measure the foods that you will eat with a food scale or measuring cup, if needed.   Decide how many standard-size servings you will eat.  Multiply the number of servings by 15. Most carbohydrate-rich foods have about 15 g of carbohydrates per serving. ? For example, if you eat 8 oz (170 g) of strawberries, you will have eaten 2 servings and 30 g of carbohydrates (2 servings x 15 g = 30 g).  For foods that have more than one food mixed, such as soups and casseroles, you must count the carbohydrates in each food that is included. The following list contains standard serving sizes of common carbohydrate-rich foods. Each of these servings has about 15 g of carbohydrates:   hamburger bun or  English muffin.   oz (15 mL) syrup.   oz (14 g) jelly.  1 slice of bread.  1 six-inch tortilla.  3 oz (85 g) cooked rice or pasta.  4 oz (113 g) cooked dried beans.  4 oz (113 g) starchy vegetable, such as peas, corn, or potatoes.  4 oz (113 g) hot cereal.  4 oz (113 g) mashed potatoes or  of a large baked potato.  4 oz (113 g) canned or frozen fruit.  4 oz (120 mL) fruit juice.  4-6 crackers.  6 chicken nuggets.  6 oz (170 g) unsweetened dry cereal.  6 oz (170 g) plain fat-free yogurt or yogurt sweetened with artificial sweeteners.  8 oz (240 mL) milk.  8 oz (170 g) fresh fruit or one small piece of fruit.  24 oz (680 g) popped popcorn. Example of carbohydrate counting Sample meal  3 oz (85 g) chicken breast.  6 oz (170 g)   brown rice.  4 oz (113 g) corn.  8 oz (240 mL) milk.  8 oz (170 g) strawberries with sugar-free whipped topping. Carbohydrate calculation 1. Identify the foods that contain carbohydrates: ? Rice. ? Corn. ? Milk. ? Strawberries. 2. Calculate how many servings you have of each food: ? 2 servings rice. ? 1 serving corn. ? 1 serving milk. ? 1 serving strawberries. 3. Multiply each number of servings by 15 g: ? 2 servings rice x 15 g = 30 g. ? 1 serving corn x 15 g = 15 g. ? 1 serving milk x 15 g = 15 g. ? 1 serving  strawberries x 15 g = 15 g. 4. Add together all of the amounts to find the total grams of carbohydrates eaten: ? 30 g + 15 g + 15 g + 15 g = 75 g of carbohydrates total. Summary  Carbohydrate counting is a method of keeping track of how many carbohydrates you eat.  Eating carbohydrates naturally increases the amount of sugar (glucose) in the blood.  Counting how many carbohydrates you eat helps keep your blood glucose within normal limits, which helps you manage your diabetes.  A diet and nutrition specialist (registered dietitian) can help you make a meal plan and calculate how many carbohydrates you should have at each meal and snack. This information is not intended to replace advice given to you by your health care provider. Make sure you discuss any questions you have with your health care provider. Document Released: 05/10/2005 Document Revised: 11/17/2016 Document Reviewed: 10/22/2015 Elsevier Interactive Patient Education  2019 Elsevier Inc.  

## 2018-08-03 LAB — LIPID PANEL
Chol/HDL Ratio: 3 ratio (ref 0.0–5.0)
Cholesterol, Total: 113 mg/dL (ref 100–199)
HDL: 38 mg/dL — AB (ref >39)
LDL Calculated: 60 mg/dL (ref 0–99)
TRIGLYCERIDES: 77 mg/dL (ref 0–149)
VLDL CHOLESTEROL CAL: 15 mg/dL (ref 5–40)

## 2018-08-03 LAB — CBC WITH DIFFERENTIAL/PLATELET
Basophils Absolute: 0.1 10*3/uL (ref 0.0–0.2)
Basos: 1 %
EOS (ABSOLUTE): 1.9 10*3/uL — ABNORMAL HIGH (ref 0.0–0.4)
Eos: 21 %
Hematocrit: 43.2 % (ref 37.5–51.0)
Hemoglobin: 14.3 g/dL (ref 13.0–17.7)
Immature Grans (Abs): 0 10*3/uL (ref 0.0–0.1)
Immature Granulocytes: 0 %
Lymphocytes Absolute: 2.1 10*3/uL (ref 0.7–3.1)
Lymphs: 24 %
MCH: 30 pg (ref 26.6–33.0)
MCHC: 33.1 g/dL (ref 31.5–35.7)
MCV: 91 fL (ref 79–97)
Monocytes Absolute: 0.6 10*3/uL (ref 0.1–0.9)
Monocytes: 6 %
Neutrophils Absolute: 4.1 10*3/uL (ref 1.4–7.0)
Neutrophils: 48 %
Platelets: 140 10*3/uL — ABNORMAL LOW (ref 150–450)
RBC: 4.76 x10E6/uL (ref 4.14–5.80)
RDW: 12.1 % (ref 11.6–15.4)
WBC: 8.7 10*3/uL (ref 3.4–10.8)

## 2018-08-03 LAB — CMP14+EGFR
A/G RATIO: 1.7 (ref 1.2–2.2)
ALT: 23 IU/L (ref 0–44)
AST: 23 IU/L (ref 0–40)
Albumin: 4.3 g/dL (ref 3.6–4.6)
Alkaline Phosphatase: 101 IU/L (ref 39–117)
BUN/Creatinine Ratio: 13 (ref 10–24)
BUN: 19 mg/dL (ref 8–27)
Bilirubin Total: 0.3 mg/dL (ref 0.0–1.2)
CALCIUM: 9.3 mg/dL (ref 8.6–10.2)
CO2: 26 mmol/L (ref 20–29)
Chloride: 104 mmol/L (ref 96–106)
Creatinine, Ser: 1.46 mg/dL — ABNORMAL HIGH (ref 0.76–1.27)
GFR calc Af Amer: 51 mL/min/{1.73_m2} — ABNORMAL LOW (ref >59)
GFR calc non Af Amer: 44 mL/min/{1.73_m2} — ABNORMAL LOW (ref >59)
Globulin, Total: 2.6 g/dL (ref 1.5–4.5)
Glucose: 179 mg/dL — ABNORMAL HIGH (ref 65–99)
Potassium: 4.7 mmol/L (ref 3.5–5.2)
Sodium: 142 mmol/L (ref 134–144)
Total Protein: 6.9 g/dL (ref 6.0–8.5)

## 2018-08-08 ENCOUNTER — Encounter: Payer: Self-pay | Admitting: Family Medicine

## 2018-08-11 DIAGNOSIS — H52223 Regular astigmatism, bilateral: Secondary | ICD-10-CM | POA: Diagnosis not present

## 2018-08-11 DIAGNOSIS — H524 Presbyopia: Secondary | ICD-10-CM | POA: Diagnosis not present

## 2018-08-24 DIAGNOSIS — Z79899 Other long term (current) drug therapy: Secondary | ICD-10-CM | POA: Diagnosis not present

## 2018-08-24 DIAGNOSIS — I252 Old myocardial infarction: Secondary | ICD-10-CM | POA: Diagnosis not present

## 2018-08-24 DIAGNOSIS — M47812 Spondylosis without myelopathy or radiculopathy, cervical region: Secondary | ICD-10-CM | POA: Diagnosis not present

## 2018-08-24 DIAGNOSIS — M5030 Other cervical disc degeneration, unspecified cervical region: Secondary | ICD-10-CM | POA: Diagnosis not present

## 2018-08-24 DIAGNOSIS — M479 Spondylosis, unspecified: Secondary | ICD-10-CM | POA: Diagnosis not present

## 2018-09-13 DIAGNOSIS — Z45010 Encounter for checking and testing of cardiac pacemaker pulse generator [battery]: Secondary | ICD-10-CM | POA: Diagnosis not present

## 2018-09-13 DIAGNOSIS — I441 Atrioventricular block, second degree: Secondary | ICD-10-CM | POA: Diagnosis not present

## 2018-09-13 DIAGNOSIS — I472 Ventricular tachycardia: Secondary | ICD-10-CM | POA: Diagnosis not present

## 2018-09-13 DIAGNOSIS — I10 Essential (primary) hypertension: Secondary | ICD-10-CM | POA: Diagnosis not present

## 2018-09-13 DIAGNOSIS — Z45018 Encounter for adjustment and management of other part of cardiac pacemaker: Secondary | ICD-10-CM | POA: Diagnosis not present

## 2018-10-08 ENCOUNTER — Other Ambulatory Visit: Payer: Self-pay | Admitting: Family Medicine

## 2018-10-19 DIAGNOSIS — M479 Spondylosis, unspecified: Secondary | ICD-10-CM | POA: Diagnosis not present

## 2018-10-19 DIAGNOSIS — Z79899 Other long term (current) drug therapy: Secondary | ICD-10-CM | POA: Diagnosis not present

## 2018-10-19 DIAGNOSIS — M5030 Other cervical disc degeneration, unspecified cervical region: Secondary | ICD-10-CM | POA: Diagnosis not present

## 2018-10-19 DIAGNOSIS — M47812 Spondylosis without myelopathy or radiculopathy, cervical region: Secondary | ICD-10-CM | POA: Diagnosis not present

## 2018-10-19 DIAGNOSIS — I252 Old myocardial infarction: Secondary | ICD-10-CM | POA: Diagnosis not present

## 2018-10-24 DIAGNOSIS — E1165 Type 2 diabetes mellitus with hyperglycemia: Secondary | ICD-10-CM | POA: Diagnosis not present

## 2018-10-24 DIAGNOSIS — E7800 Pure hypercholesterolemia, unspecified: Secondary | ICD-10-CM | POA: Diagnosis not present

## 2018-10-24 DIAGNOSIS — Z1159 Encounter for screening for other viral diseases: Secondary | ICD-10-CM | POA: Diagnosis not present

## 2018-10-24 DIAGNOSIS — Z711 Person with feared health complaint in whom no diagnosis is made: Secondary | ICD-10-CM | POA: Diagnosis not present

## 2018-10-24 DIAGNOSIS — E559 Vitamin D deficiency, unspecified: Secondary | ICD-10-CM | POA: Diagnosis not present

## 2018-10-24 DIAGNOSIS — Z0000 Encounter for general adult medical examination without abnormal findings: Secondary | ICD-10-CM | POA: Diagnosis not present

## 2018-11-02 ENCOUNTER — Ambulatory Visit (INDEPENDENT_AMBULATORY_CARE_PROVIDER_SITE_OTHER): Payer: Medicare Other | Admitting: Family Medicine

## 2018-11-02 ENCOUNTER — Ambulatory Visit (INDEPENDENT_AMBULATORY_CARE_PROVIDER_SITE_OTHER): Payer: Medicare Other | Admitting: *Deleted

## 2018-11-02 ENCOUNTER — Other Ambulatory Visit: Payer: Self-pay

## 2018-11-02 ENCOUNTER — Encounter: Payer: Self-pay | Admitting: Family Medicine

## 2018-11-02 ENCOUNTER — Encounter: Payer: Self-pay | Admitting: *Deleted

## 2018-11-02 DIAGNOSIS — Z0000 Encounter for general adult medical examination without abnormal findings: Secondary | ICD-10-CM

## 2018-11-02 DIAGNOSIS — E1149 Type 2 diabetes mellitus with other diabetic neurological complication: Secondary | ICD-10-CM

## 2018-11-02 DIAGNOSIS — Z794 Long term (current) use of insulin: Secondary | ICD-10-CM

## 2018-11-02 DIAGNOSIS — E1140 Type 2 diabetes mellitus with diabetic neuropathy, unspecified: Secondary | ICD-10-CM | POA: Diagnosis not present

## 2018-11-02 DIAGNOSIS — E1165 Type 2 diabetes mellitus with hyperglycemia: Secondary | ICD-10-CM

## 2018-11-02 MED ORDER — INSULIN REGULAR HUMAN 100 UNIT/ML IJ SOLN: 10.0000 [IU] | Freq: Two times a day (BID) | INTRAMUSCULAR | 1 refills | Status: DC

## 2018-11-02 MED ORDER — LEVEMIR FLEXTOUCH 100 UNIT/ML ~~LOC~~ SOPN: 60.0000 [IU] | PEN_INJECTOR | Freq: Every day | SUBCUTANEOUS | 3 refills | Status: DC

## 2018-11-02 MED ORDER — PSEUDOEPHEDRINE-GUAIFENESIN ER 60-600 MG PO TB12: 1.0000 | ORAL_TABLET | Freq: Two times a day (BID) | ORAL | 0 refills | Status: AC

## 2018-11-02 MED ORDER — AZELASTINE HCL 0.1 % NA SOLN: 2.0000 | Freq: Two times a day (BID) | NASAL | 12 refills | Status: AC

## 2018-11-02 NOTE — Progress Notes (Signed)
Subjective:    Patient ID: Thomas Maldonado, male    DOB: 04/04/1934, 85 y.o.   MRN: [id number]   HPI: Thomas Maldonado is a 85 y.o. male presenting for diabetes follow up. Fasting 139 today. usuaully 100-140. Through the day PP 250 is common. Denies lows. Says his boady is used to his glucose being high, and he feels bad if it drops - even if it is still too hih after the drop.  Having HA. Sinus. Eyes burning and ache behind eyes. Onset 2 weeks ago. Depression screen [facility] 2/9 11/02/2018 08/02/2018 06/29/2018 06/12/2018 05/08/2018  Decreased Interest 2 0 0 0 0  Down, Depressed, Hopeless 2 0 0 0 0  PHQ - 2 Score 4 0 0 0 0  Altered sleeping 0 - 0 - -  Tired, decreased energy 3 - 0 - -  Change in appetite 1 - 0 - -  Feeling bad or failure about yourself  2 - 0 - -  Trouble concentrating 1 - 0 - -  Moving slowly or fidgety/restless 0 - 0 - -  Suicidal thoughts 0 - 0 - -  PHQ-9 Score 11 - 0 - -  Difficult doing work/chores - - Not difficult at all - -     Relevant past medical, surgical, family and social history reviewed and updated as indicated.  Interim medical history since our last visit reviewed. Allergies and medications reviewed and updated.  ROS:  Review of Systems  Constitutional: Negative.   HENT: Negative.   Eyes: Negative for visual disturbance.  Respiratory: Negative for cough and shortness of breath.   Cardiovascular: Negative for chest pain and leg swelling.  Gastrointestinal: Negative for abdominal pain, diarrhea, nausea and vomiting.  Genitourinary: Negative for difficulty urinating.  Musculoskeletal: Negative for arthralgias and myalgias.  Skin: Negative for rash.  Neurological: Negative for headaches.  Psychiatric/Behavioral: Negative for sleep disturbance.     Social History   Tobacco Use  Smoking Status Former Smoker  . Types: Cigarettes  . Quit date: 11/02/1981  . Years since quitting: 37.0  Smokeless Tobacco Never Used       Objective:     Wt Readings  from Last 3 Encounters:  08/02/18 224 lb 6 oz (101.8 kg)  06/29/18 227 lb (103 kg)  06/23/18 224 lb (101.6 kg)     Exam deferred. Pt. Harboring due to COVID 19. Phone visit performed.   Assessment & Plan:   1. Diabetic neuropathy with neurologic complication ([place])   2. Type 2 diabetes mellitus with hyperglycemia, with long-term current use of insulin (HCC)     Meds ordered this encounter  Medications  . pseudoephedrine-guaifenesin (MUCINEX D) 60-600 MG 12 hr tablet    Sig: Take 1 tablet by mouth every 12 (twelve) hours for 14 days. As needed for congestion    Dispense:  20 tablet    Refill:  0  . azelastine (ASTELIN) 0.1 % nasal spray    Sig: Place 2 sprays into both nostrils 2 (two) times daily. Use in each nostril as directed    Dispense:  30 mL    Refill:  12  . insulin regular (HUMULIN R) 100 units/mL injection    Sig: Inject 0.1 mLs (10 Units total) into the skin 2 (two) times daily before a meal.    Dispense:  30 mL    Refill:  1  . Insulin Detemir (LEVEMIR FLEXTOUCH) 100 UNIT/ML Pen    Sig: Inject 60 Units into the skin daily.  Dispense:  30 mL    Refill:  3    No orders of the defined types were placed in this encounter.     Diagnoses and all orders for this visit:  Diabetic neuropathy with neurologic complication ([place])  Type 2 diabetes mellitus with hyperglycemia, with long-term current use of insulin (HCC)  Other orders -     pseudoephedrine-guaifenesin (MUCINEX D) 60-600 MG 12 hr tablet; Take 1 tablet by mouth every 12 (twelve) hours for 14 days. As needed for congestion -     azelastine (ASTELIN) 0.1 % nasal spray; Place 2 sprays into both nostrils 2 (two) times daily. Use in each nostril as directed -     insulin regular (HUMULIN R) 100 units/mL injection; Inject 0.1 mLs (10 Units total) into the skin 2 (two) times daily before a meal. -     Insulin Detemir (LEVEMIR FLEXTOUCH) 100 UNIT/ML Pen; Inject 60 Units into the skin daily.    Virtual Visit via  telephone Note  I discussed the limitations, risks, security and privacy concerns of performing an evaluation and management service by telephone and the availability of in person appointments. The patient was identified with two identifiers. Pt.expressed understanding and agreed to proceed. Pt. Is at home. Dr. Livia Snellen is in his office.  Follow Up Instructions:   I discussed the assessment and treatment plan with the patient. The patient was provided an opportunity to ask questions and all were answered. The patient agreed with the plan and demonstrated an understanding of the instructions.   The patient was advised to call back or seek an in-person evaluation if the symptoms worsen or if the condition fails to improve as anticipated.   Total minutes including chart review and phone contact time: 25   Follow up plan: Return in about 3 months (around 02/02/2019).  Claretta Fraise, MD [place]

## 2018-11-02 NOTE — Progress Notes (Signed)
MEDICARE ANNUAL WELLNESS VISIT  11/02/2018  Telephone Visit Disclaimer This Medicare AWV was conducted by telephone due to national recommendations for restrictions regarding the COVID-19 Pandemic (e.g. social distancing).  I verified, using two identifiers, that I am speaking with Thomas Maldonado or their authorized healthcare agent. I discussed the limitations, risks, security, and privacy concerns of performing an evaluation and management service by telephone and the potential availability of an in-person appointment in the future. The patient expressed understanding and agreed to proceed.   Subjective:  Thomas Maldonado is a 85 y.o. male patient of Stacks, Cletus Gash, MD who had a Medicare Annual Wellness Visit today via telephone. 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 he has 4 children and numerous grandchildren and great grandchildren. He reports that he is socially active and does interact with friends/family regularly. He is minimally physically active and enjoys watching television.  He states he tries to do some walking around his yard, but it has been too hot lately.   Patient Care Team: Claretta Fraise, MD as PCP - General (Family Medicine) Janyce Llanos, NP as Nurse Practitioner (Cardiology) Lawrence Marseilles, MD as Referring Physician (Specialist)  Advanced Directives 11/02/2018 11/02/2018 06/23/2018 05/26/2018 05/26/2018  Does Patient Have a Medical Advance Directive? No No No - No  Would patient like information on creating a medical advance directive? Yes (MAU/Ambulatory/Procedural Areas - Information given) Yes (MAU/Ambulatory/Procedural Areas - Information given) - Yes (Inpatient - patient defers creating a medical advance directive at this time) [facility] Utilization Over the Past 12 Months: # of hospitalizations or ER visits: 0 # of surgeries: 0  Review of Systems    Patient reports that his overall health is worse compared to last year.    Musculoskeletal- positive for neck and back pain  All other systems negative.  Pain Assessment Pain Score: 7      Current Medications & Allergies (verified) Allergies as of 11/02/2018      Reactions   Metformin And Related Nausea Only   Prednisone Anxiety   Pt reports "with all steriods gets anxious, tremors, and tears me all to pieces"      Medication List       Accurate as of November 02, 2018 10:03 AM. If you have any questions, ask your nurse or doctor.        Accu-Chek Guide w/Device Kit 1 each by Does not apply route 2 (two) times daily.   accu-chek soft touch lancets Use as instructed   acetaminophen 500 MG tablet Commonly known as: TYLENOL Take 500-1,000 mg by mouth every 6 (six) hours as needed (FOR HEADACHES).   aspirin 81 MG tablet Take 81 mg by mouth every evening.   celecoxib 200 MG capsule Commonly known as: CELEBREX Take 1 capsule (200 mg total) by mouth daily.   ezetimibe 10 MG tablet Commonly known as: ZETIA TAKE 1 TABLET BY MOUTH  DAILY   glipiZIDE 10 MG tablet Commonly known as: GLUCOTROL TAKE 1 TABLET BY MOUTH 2  TIMES DAILY BEFORE MEALS   glucose blood test strip Use as instructed   Insulin Detemir 100 UNIT/ML Pen Commonly known as: Levemir FlexTouch Inject 50 Units into the skin daily.   insulin regular 100 units/mL injection Commonly known as: HumuLIN R INJECT SUBCUTANEOUSLY 6 TO  10 UNITS TWO TIMES A DAY  BEFORE MEALS , PER SLIDING  SCALE UP TO 50 UNITS PER  DAY MAX What changed:   how much to take  how to take this  when to take this  additional instructions   lisinopril 10 MG tablet Commonly known as: ZESTRIL Take 1 tablet (10 mg total) by mouth daily.   meclizine 25 MG tablet Commonly known as: ANTIVERT Take 1 tablet (25 mg total) by mouth every 6 (six) hours as needed for dizziness or nausea.   metoprolol tartrate 25 MG tablet Commonly known as: LOPRESSOR Take 0.5 tablets (12.5 mg total) by mouth 2 (two) times  daily.   omeprazole 20 MG capsule Commonly known as: PRILOSEC TAKE 2 CAPSULES BY MOUTH  DAILY   ondansetron 4 MG disintegrating tablet Commonly known as: ZOFRAN-ODT Take 1 tablet (4 mg total) by mouth every 8 (eight) hours as needed for nausea or vomiting.   ondansetron 4 MG tablet Commonly known as: ZOFRAN Take 1 tablet (4 mg total) by mouth every 8 (eight) hours as needed for nausea or vomiting.   oxyCODONE-acetaminophen 10-325 MG tablet Commonly known as: PERCOCET Take 1 tablet by mouth every 6 (six) hours as needed for pain.   pravastatin 20 MG tablet Commonly known as: PRAVACHOL Take 1 tablet (20 mg total) by mouth daily. What changed: when to take this   Vitamin D (Ergocalciferol) 1.25 MG (50000 UT) Caps capsule Commonly known as: DRISDOL Take 1 capsule (50,000 Units total) by mouth every Thursday.       History (reviewed): Past Medical History:  Diagnosis Date  . Acute upper respiratory infections of unspecified site   . AV block    a. s/p Medtr[employer] PPM - [facility].  Marland Kitchen CAD (coronary artery disease)    a.  inferolateral MI 1999 s/p stent to Cx and DES to prox LAD 2013.  Marland Kitchen Chronic kidney disease, stage III (moderate) (HCC)   . Coronary atherosclerosis of unspecified type of vessel, native or graft 1999  . Diabetes mellitus without complication ([place])   . Esophageal reflux   . Hypertension   . Nausea   . NSVT (nonsustained ventricular tachycardia) ([place])   . Other chronic pain   . Problems with hearing   . Problems with sight   . Pure hypercholesterolemia   . Type II or unspecified type diabetes mellitus with renal manifestations, not stated as uncontrolled(250.40)    Past Surgical History:  Procedure Laterality Date  . COLON SURGERY    . FOOT SURGERY Left 1982  . HERNIA REPAIR    . KNEE ARTHROSCOPY Right 2000  . LEFT HEART CATH AND CORONARY ANGIOGRAPHY N/A 05/26/2018   Procedure: LEFT HEART CATH AND CORONARY ANGIOGRAPHY;  Surgeon: Jettie Booze, MD;   Location: [place] [facility];  Service: Cardiovascular;  Laterality: N/A;  . NECK SURGERY  2008   Plate in neck  . PACEMAKER INSERTION  2014  . SHOULDER SURGERY  2000  . TRANSURETHRAL PROSTATECTOMY WITH GYRUS INSTRUMENTS  1999   Family History  Problem Relation Age of Onset  . Diabetes Father   . Cancer Brother   . Heart disease Brother   . Diabetes Brother   . Cancer Brother   . Heart disease Brother   . Diabetes Brother   . Cancer Brother   . Heart disease Brother   . Diabetes Brother   . Cancer Brother   . Heart disease Brother   . Cancer Brother   . Heart disease Brother   . Cancer Brother   . Heart disease Brother   . Cancer Sister   . Heart disease Sister   . Diabetes Sister   .  Cancer Sister   . Heart disease Sister   . Diabetes Sister   . Cancer Sister   . Heart disease Sister   . Cancer Sister   . Heart disease Sister    Social History   Socioeconomic History  . Marital status: Divorced    Spouse name: Not on file  . Number of children: 4  . Years of education: 6  . Highest education level: 6th grade  Occupational History  . Occupation: retired    Comment: Barista  . Financial resource strain: Not hard at all  . Food insecurity    Worry: Never true    Inability: Never true  . Transportation needs    Medical: No    Non-medical: No  Tobacco Use  . Smoking status: Former Smoker    Types: Cigarettes    Quit date: 11/02/1981    Years since quitting: 37.0  . Smokeless tobacco: Never Used  Substance and Sexual Activity  . Alcohol use: No  . Drug use: No  . Sexual activity: Not Currently  Lifestyle  . Physical activity    Days per week: 0 days    Minutes per session: 0 min  . Stress: To some extent  Relationships  . Social connections    Talks on phone: More than three times a week    Gets together: More than three times a week    Attends religious service: Never    Active member of club or organization: No    Attends  meetings of clubs or organizations: Never    Relationship status: Divorced  Other Topics Concern  . Not on file  Social History Narrative  . Not on file    Activities of Daily Living In your present state of health, do you have any difficulty performing the following activities: 11/02/2018 05/26/2018  Hearing? Tempie Maldonado  Comment Wears hearing aids -  Vision? Y N  Comment Blurred vision even with new glasses -  Difficulty concentrating or making decisions? Y N  Comment Trouble remembering -  Walking or climbing stairs? Y N  Dressing or bathing? N N  Doing errands, shopping? Y N  Comment Cannot stand for long periods at grocery store -  Conservation officer, nature and eating ? Y -  Comment Daughters prepare food -  Using the Toilet? N -  In the past six months, have you accidently leaked urine? Y -  Comment Some leaking -  Do you have problems with loss of bowel control? N -  Managing your Medications? N -  Managing your Finances? N -  Housekeeping or managing your Housekeeping? N -  Some recent data might be hidden        Exercise  Does not participate in regular exercise currently  Diet Patient reports consuming 2 meals a day and 2 snack(s) a day Patient reports that his primary diet is: Diabetic Patient reports that she does have regular access to food.   Depression Screen PHQ 2/9 Scores 11/02/2018 08/02/2018 06/29/2018 06/12/2018 05/08/2018 04/10/2018 04/03/2018  PHQ - 2 Score 4 0 0 0 0 0 3  PHQ- 9 Score 11 - 0 - - - 6     Fall Risk Fall Risk  11/02/2018 08/02/2018 06/29/2018 05/08/2018 04/03/2018  Falls in the past year? 0 0 0 1 1  Number falls in past yr: - - - 0 0  Injury with Fall? - - - 0 0     Objective:  Thomas Maldonado seemed alert  and oriented and he participated appropriately during our telephone visit.  Blood Pressure Weight BMI  BP Readings from Last 3 Encounters:  08/02/18 137/79  06/29/18 (!) 153/78  06/24/18 105/68   Wt Readings from Last 3 Encounters:  08/02/18 224 lb  6 oz (101.8 kg)  06/29/18 227 lb (103 kg)  06/23/18 224 lb (101.6 kg)   BMI Readings from Last 1 Encounters:  08/02/18 30.43 kg/m    *Unable to obtain current vital signs, weight, and BMI due to telephone visit type  Hearing/Vision  . Thomas Maldonado did  seem to have difficulty with hearing/understanding during the telephone conversation . Reports that he has had a formal eye exam by an eye care professional within the past year . Reports that he has not had a formal hearing evaluation within the past year *Unable to fully assess hearing and vision during telephone visit type  Cognitive Function: 6CIT Screen 11/02/2018  What Year? 0 points  What month? 0 points  What time? 0 points  Count back from 20 0 points  Months in reverse 4 points  Repeat phrase 6 points  Total Score 10    Normal Cognitive Function Screening: No:  (Normal:0-7, Significant for Dysfunction: >8)  Immunization & Health Maintenance Record Immunization History  Administered Date(s) Administered  . Influenza Split 02/21/2010, 04/01/2011, 04/11/2012  . Influenza, High Dose Seasonal PF 04/03/2018  . Influenza, Seasonal, Injecte, Preservative Fre 03/11/2015, 03/02/2016  . Influenza,inj,Quad PF,6+ Mos 03/11/2015, 03/02/2016  . Influenza,inj,quad, With Preservative 04/13/2013, 02/18/2014  . Influenza-Unspecified 02/21/2010, 04/01/2011, 04/11/2012, 04/13/2013, 02/18/2014, 03/11/2015, 03/02/2016, 02/08/2017  . Pneumococcal Conjugate-13 07/25/2013  . Pneumococcal Polysaccharide-23 02/21/2010    Health Maintenance  Topic Date Due  . INFLUENZA VACCINE  12/23/2018  . HEMOGLOBIN A1C  02/02/2019  . OPHTHALMOLOGY EXAM  07/15/2019  . FOOT EXAM  08/02/2019  . TETANUS/TDAP  04/03/2024  . PNA vac Low Risk Adult  Completed       Assessment  This is a routine wellness examination for Thomas Maldonado.  Health Maintenance: Due or Overdue There are no preventive care reminders to display for this patient.  Thomas Maldonado needs a  Maldonado for [employer] Assistance:   Patient declined- could benefit from counseling with Social worker Care Management:   no Social Work:    yes Prescription Assistance:  no Nutrition/Diabetes Education:  no   Plan:  Personalized Goals Goals Addressed            This Visit's Progress   . Increase physical activity       Get more walking in each day      Personalized Health Maintenance & Screening Recommendations  Advanced directives: has NO advanced directive  - add't info requested. Maldonado to SW: no Shingrix Vaccine Series    Lung Cancer Screening Recommended: not applicable (Low Dose CT Chest recommended if Age 57-80 years, 30 pack-year currently smoking OR have quit w/in past 15 years) Hepatitis C Screening recommended: not applicable   Advanced Directives: Written information was prepared per patient's request.  Referrals & Orders N/A  Follow-up Plan . Follow-up with Claretta Fraise, MD as planned . Work on your goal of increasing walking daily. . Please review the information given on Advance Directives.  If you complete the paperwork, please bring a copy to our office to be filed in your medical record.    I have personally reviewed and noted the following in the patient's chart:   . Medical and social history . Use of alcohol, tobacco or illicit  drugs  . Current medications and supplements . Functional ability and status . Nutritional status . Physical activity . Advanced directives . List of other physicians . Hospitalizations, surgeries, and ER visits in previous 12 months . Vitals . Screenings to include cognitive, depression, and falls . Referrals and appointments  In addition, I have reviewed and discussed with Thomas Maldonado certain preventive protocols, quality metrics, and best practice recommendations. A written personalized care plan for preventive services as well as general preventive health recommendations is available and can be mailed to the  patient at his request.      Nolberto Hanlon, RN  11/02/2018

## 2018-11-02 NOTE — Patient Instructions (Signed)
  Thomas Maldonado , Thank you for taking time to talk with me for your Medicare Wellness Visit. I appreciate your ongoing commitment to your health goals. Please review the following plan we discussed and let me know if I can assist you in the future.   These are the goals we discussed: Goals    . Increase physical activity     Get more walking in each day       This is a list of the screening recommended for you and due dates:  Health Maintenance  Topic Date Due  . Flu Shot  12/23/2018  . Hemoglobin A1C  02/02/2019  . Eye exam for diabetics  07/15/2019  . Complete foot exam   08/02/2019  . Tetanus Vaccine  04/03/2024  . Pneumonia vaccines  Completed

## 2018-11-07 ENCOUNTER — Other Ambulatory Visit: Payer: Self-pay | Admitting: Family Medicine

## 2018-11-17 ENCOUNTER — Other Ambulatory Visit: Payer: Self-pay | Admitting: Family Medicine

## 2018-11-17 ENCOUNTER — Other Ambulatory Visit: Payer: Self-pay | Admitting: *Deleted

## 2018-11-17 MED ORDER — PRAVASTATIN SODIUM 20 MG PO TABS: 20.0000 mg | ORAL_TABLET | Freq: Every day | ORAL | 0 refills | Status: DC

## 2018-11-17 MED ORDER — TOPIRAMATE 25 MG PO TABS: 25.0000 mg | ORAL_TABLET | Freq: Every day | ORAL | 1 refills | Status: DC

## 2018-11-17 NOTE — Telephone Encounter (Signed)
Refill  RE-sent

## 2018-11-17 NOTE — Telephone Encounter (Signed)
What is the name of the medication? Pravastatin 20mg    Have you contacted your pharmacy to request a refill? yes  Which pharmacy would you like this sent to? [facility] in [place]   Patient notified that their request is being sent to the clinical staff for review and that they should receive a call once it is complete. If they do not receive a call within 24 hours they can check with their pharmacy or our office.

## 2018-11-21 DIAGNOSIS — E7800 Pure hypercholesterolemia, unspecified: Secondary | ICD-10-CM | POA: Diagnosis not present

## 2018-11-21 DIAGNOSIS — E119 Type 2 diabetes mellitus without complications: Secondary | ICD-10-CM | POA: Diagnosis not present

## 2018-11-21 DIAGNOSIS — G43709 Chronic migraine without aura, not intractable, without status migrainosus: Secondary | ICD-10-CM | POA: Diagnosis not present

## 2018-11-21 DIAGNOSIS — I10 Essential (primary) hypertension: Secondary | ICD-10-CM | POA: Diagnosis not present

## 2018-12-08 ENCOUNTER — Encounter: Payer: Self-pay | Admitting: *Deleted

## 2018-12-14 DIAGNOSIS — M7912 Myalgia of auxiliary muscles, head and neck: Secondary | ICD-10-CM | POA: Diagnosis not present

## 2018-12-14 DIAGNOSIS — M47816 Spondylosis without myelopathy or radiculopathy, lumbar region: Secondary | ICD-10-CM | POA: Diagnosis not present

## 2018-12-14 DIAGNOSIS — G43709 Chronic migraine without aura, not intractable, without status migrainosus: Secondary | ICD-10-CM | POA: Diagnosis not present

## 2018-12-14 DIAGNOSIS — M47812 Spondylosis without myelopathy or radiculopathy, cervical region: Secondary | ICD-10-CM | POA: Diagnosis not present

## 2018-12-14 DIAGNOSIS — Z79899 Other long term (current) drug therapy: Secondary | ICD-10-CM | POA: Diagnosis not present

## 2018-12-14 DIAGNOSIS — M5030 Other cervical disc degeneration, unspecified cervical region: Secondary | ICD-10-CM | POA: Diagnosis not present

## 2018-12-15 DIAGNOSIS — I441 Atrioventricular block, second degree: Secondary | ICD-10-CM | POA: Diagnosis not present

## 2018-12-15 DIAGNOSIS — Z950 Presence of cardiac pacemaker: Secondary | ICD-10-CM | POA: Diagnosis not present

## 2018-12-25 ENCOUNTER — Other Ambulatory Visit: Payer: Self-pay | Admitting: Family Medicine

## 2018-12-25 MED ORDER — GLIPIZIDE 10 MG PO TABS: ORAL_TABLET | ORAL | 0 refills | Status: DC

## 2018-12-25 MED ORDER — CELECOXIB 200 MG PO CAPS: 200.0000 mg | ORAL_CAPSULE | Freq: Every day | ORAL | 0 refills | Status: DC

## 2018-12-25 NOTE — Telephone Encounter (Signed)
Pt aware refills sent to pharmacy 

## 2019-01-05 ENCOUNTER — Other Ambulatory Visit: Payer: Self-pay | Admitting: Family Medicine

## 2019-01-31 ENCOUNTER — Ambulatory Visit (INDEPENDENT_AMBULATORY_CARE_PROVIDER_SITE_OTHER): Payer: Medicare Other | Admitting: Family Medicine

## 2019-01-31 ENCOUNTER — Encounter: Payer: Self-pay | Admitting: Family Medicine

## 2019-01-31 DIAGNOSIS — N183 Chronic kidney disease, stage 3 unspecified: Secondary | ICD-10-CM

## 2019-01-31 DIAGNOSIS — E7800 Pure hypercholesterolemia, unspecified: Secondary | ICD-10-CM | POA: Diagnosis not present

## 2019-01-31 DIAGNOSIS — I10 Essential (primary) hypertension: Secondary | ICD-10-CM | POA: Diagnosis not present

## 2019-01-31 DIAGNOSIS — E1140 Type 2 diabetes mellitus with diabetic neuropathy, unspecified: Secondary | ICD-10-CM

## 2019-01-31 DIAGNOSIS — J309 Allergic rhinitis, unspecified: Secondary | ICD-10-CM | POA: Diagnosis not present

## 2019-01-31 DIAGNOSIS — R110 Nausea: Secondary | ICD-10-CM

## 2019-01-31 DIAGNOSIS — E1149 Type 2 diabetes mellitus with other diabetic neurological complication: Secondary | ICD-10-CM

## 2019-01-31 NOTE — Progress Notes (Signed)
Subjective:    Patient ID: Rishawn Walck, male    DOB: August 09, 1933, 84 y.o.   MRN: [id number]   HPI: Alando Colleran is a 85 y.o. male presenting for presents forFollow-up of diabetes. Patient checks blood sugar at home.   75 fasting and as high as 500 once or twice a week postprandial if he eats something sweet, e.g. sweet potato pie.  Patient denies symptoms such as polyuria, polydipsia, excessive hunger, nausea No significant hypoglycemic spells noted. Medications reviewed. Pt reports taking them regularly without complication/adverse reaction being reported today.  Checking feet daily. Last eye appt was 3/20      Depression screen [facility] 2/9 11/02/2018 08/02/2018 06/29/2018 06/12/2018 05/08/2018  Decreased Interest 2 0 0 0 0  Down, Depressed, Hopeless 2 0 0 0 0  PHQ - 2 Score 4 0 0 0 0  Altered sleeping 0 - 0 - -  Tired, decreased energy 3 - 0 - -  Change in appetite 1 - 0 - -  Feeling bad or failure about yourself  2 - 0 - -  Trouble concentrating 1 - 0 - -  Moving slowly or fidgety/restless 0 - 0 - -  Suicidal thoughts 0 - 0 - -  PHQ-9 Score 11 - 0 - -  Difficult doing work/chores - - Not difficult at all - -     Relevant past medical, surgical, family and social history reviewed and updated as indicated.  Interim medical history since our last visit reviewed. Allergies and medications reviewed and updated.  ROS:  Review of Systems  Constitutional: Negative.   HENT: Positive for postnasal drip and rhinorrhea.   Eyes: Positive for itching (watery). Negative for visual disturbance.  Respiratory: Negative for cough and shortness of breath.   Cardiovascular: Negative for chest pain and leg swelling.  Gastrointestinal: Negative for abdominal pain, diarrhea, nausea and vomiting.  Genitourinary: Negative for difficulty urinating.  Musculoskeletal: Positive for arthralgias (sing oxycodone from pain clinic.) and back pain (Getting ESI). Negative for myalgias.  Skin: Negative for  rash.  Neurological: Negative for headaches.  Psychiatric/Behavioral: Negative for sleep disturbance.     Social History   Tobacco Use  Smoking Status Former Smoker  . Types: Cigarettes  . Quit date: 11/02/1981  . Years since quitting: 37.2  Smokeless Tobacco Never Used       Objective:     Wt Readings from Last 3 Encounters:  08/02/18 224 lb 6 oz (101.8 kg)  06/29/18 227 lb (103 kg)  06/23/18 224 lb (101.6 kg)     Exam deferred. Pt. Harboring due to COVID 19. Phone visit performed.   Assessment & Plan:   1. Diabetic neuropathy with neurologic complication ([place])   2. Essential hypertension   3. Pure hypercholesterolemia   4. CKD (chronic kidney disease) stage 3, GFR 30-59 ml/min (HCC)   5. Allergic rhinitis, unspecified seasonality, unspecified trigger   6. Nausea     No orders of the defined types were placed in this encounter.   Orders Placed This Encounter  Procedures  . Bayer DCA Hb A1c Waived  . CBC with Differential/Platelet  . CMP14+EGFR    Order Specific Question:   Has the patient fasted?    Answer:   Yes  . Lipid panel    Order Specific Question:   Has the patient fasted?    Answer:   Yes      Diagnoses and all orders for this visit:  Diabetic neuropathy with neurologic complication ([place]) -  Bayer DCA Hb A1c Waived -     CBC with Differential/Platelet -     CMP14+EGFR -     Lipid panel  Essential hypertension -     CMP14+EGFR  Pure hypercholesterolemia -     CMP14+EGFR -     Lipid panel  CKD (chronic kidney disease) stage 3, GFR 30-59 ml/min (HCC) -     CMP14+EGFR  Allergic rhinitis, unspecified seasonality, unspecified trigger  Nausea    Virtual Visit via telephone Note  I discussed the limitations, risks, security and privacy concerns of performing an evaluation and management service by telephone and the availability of in person appointments. The patient was identified with two identifiers. Pt.expressed understanding and  agreed to proceed. Pt. Is at home. Dr. Livia Snellen is in his office.  Follow Up Instructions:   I discussed the assessment and treatment plan with the patient. The patient was provided an opportunity to ask questions and all were answered. The patient agreed with the plan and demonstrated an understanding of the instructions.   The patient was advised to call back or seek an in-person evaluation if the symptoms worsen or if the condition fails to improve as anticipated.   Total minutes including chart review and phone contact time: 24   Follow up plan: Return in about 3 months (around 05/02/2019).  Claretta Fraise, MD [place]

## 2019-02-01 ENCOUNTER — Ambulatory Visit: Payer: Medicare Other | Admitting: Family Medicine

## 2019-02-02 ENCOUNTER — Ambulatory Visit: Payer: Medicare Other | Admitting: Family Medicine

## 2019-02-05 ENCOUNTER — Other Ambulatory Visit: Payer: Self-pay

## 2019-02-05 ENCOUNTER — Other Ambulatory Visit: Payer: Medicare Other

## 2019-02-05 DIAGNOSIS — E1149 Type 2 diabetes mellitus with other diabetic neurological complication: Secondary | ICD-10-CM | POA: Diagnosis not present

## 2019-02-05 DIAGNOSIS — E1140 Type 2 diabetes mellitus with diabetic neuropathy, unspecified: Secondary | ICD-10-CM | POA: Diagnosis not present

## 2019-02-05 DIAGNOSIS — N183 Chronic kidney disease, stage 3 (moderate): Secondary | ICD-10-CM | POA: Diagnosis not present

## 2019-02-05 DIAGNOSIS — E7800 Pure hypercholesterolemia, unspecified: Secondary | ICD-10-CM | POA: Diagnosis not present

## 2019-02-05 DIAGNOSIS — I10 Essential (primary) hypertension: Secondary | ICD-10-CM | POA: Diagnosis not present

## 2019-02-05 LAB — BAYER DCA HB A1C WAIVED: HB A1C (BAYER DCA - WAIVED): 7.7 % — ABNORMAL HIGH (ref <7.0)

## 2019-02-06 LAB — CBC WITH DIFFERENTIAL/PLATELET
Basophils Absolute: 0.1 10*3/uL (ref 0.0–0.2)
Basos: 1 %
EOS (ABSOLUTE): 2.5 10*3/uL — ABNORMAL HIGH (ref 0.0–0.4)
Eos: 24 %
Hematocrit: 44.4 % (ref 37.5–51.0)
Hemoglobin: 14 g/dL (ref 13.0–17.7)
Immature Grans (Abs): 0 10*3/uL (ref 0.0–0.1)
Immature Granulocytes: 0 %
Lymphocytes Absolute: 2.8 10*3/uL (ref 0.7–3.1)
Lymphs: 27 %
MCH: 29 pg (ref 26.6–33.0)
MCHC: 31.5 g/dL (ref 31.5–35.7)
MCV: 92 fL (ref 79–97)
Monocytes Absolute: 0.7 10*3/uL (ref 0.1–0.9)
Monocytes: 7 %
Neutrophils Absolute: 4.2 10*3/uL (ref 1.4–7.0)
Neutrophils: 41 %
Platelets: 138 10*3/uL — ABNORMAL LOW (ref 150–450)
RBC: 4.82 x10E6/uL (ref 4.14–5.80)
RDW: 12.9 % (ref 11.6–15.4)
WBC: 10.3 10*3/uL (ref 3.4–10.8)

## 2019-02-06 LAB — LIPID PANEL
Chol/HDL Ratio: 3.2 ratio (ref 0.0–5.0)
Cholesterol, Total: 101 mg/dL (ref 100–199)
HDL: 32 mg/dL — ABNORMAL LOW
LDL Chol Calc (NIH): 48 mg/dL (ref 0–99)
Triglycerides: 113 mg/dL (ref 0–149)
VLDL Cholesterol Cal: 21 mg/dL (ref 5–40)

## 2019-02-06 LAB — CMP14+EGFR
ALT: 16 [IU]/L (ref 0–44)
AST: 21 [IU]/L (ref 0–40)
Albumin/Globulin Ratio: 1.4 (ref 1.2–2.2)
Albumin: 4 g/dL (ref 3.6–4.6)
Alkaline Phosphatase: 97 [IU]/L (ref 39–117)
BUN/Creatinine Ratio: 14 (ref 10–24)
BUN: 18 mg/dL (ref 8–27)
Bilirubin Total: 0.4 mg/dL (ref 0.0–1.2)
CO2: 28 mmol/L (ref 20–29)
Calcium: 8.8 mg/dL (ref 8.6–10.2)
Chloride: 105 mmol/L (ref 96–106)
Creatinine, Ser: 1.3 mg/dL — ABNORMAL HIGH (ref 0.76–1.27)
GFR calc Af Amer: 58 mL/min/{1.73_m2} — ABNORMAL LOW
GFR calc non Af Amer: 50 mL/min/{1.73_m2} — ABNORMAL LOW
Globulin, Total: 2.8 g/dL (ref 1.5–4.5)
Glucose: 177 mg/dL — ABNORMAL HIGH (ref 65–99)
Potassium: 4.4 mmol/L (ref 3.5–5.2)
Sodium: 142 mmol/L (ref 134–144)
Total Protein: 6.8 g/dL (ref 6.0–8.5)

## 2019-02-06 IMAGING — DX DG CHEST 1V PORT
1 series · 1 of 1 positions shown · non-contrast
Comparison: April 18, 2013

CLINICAL DATA: Chest pain

EXAM:
PORTABLE CHEST 1 VIEW

[chest]
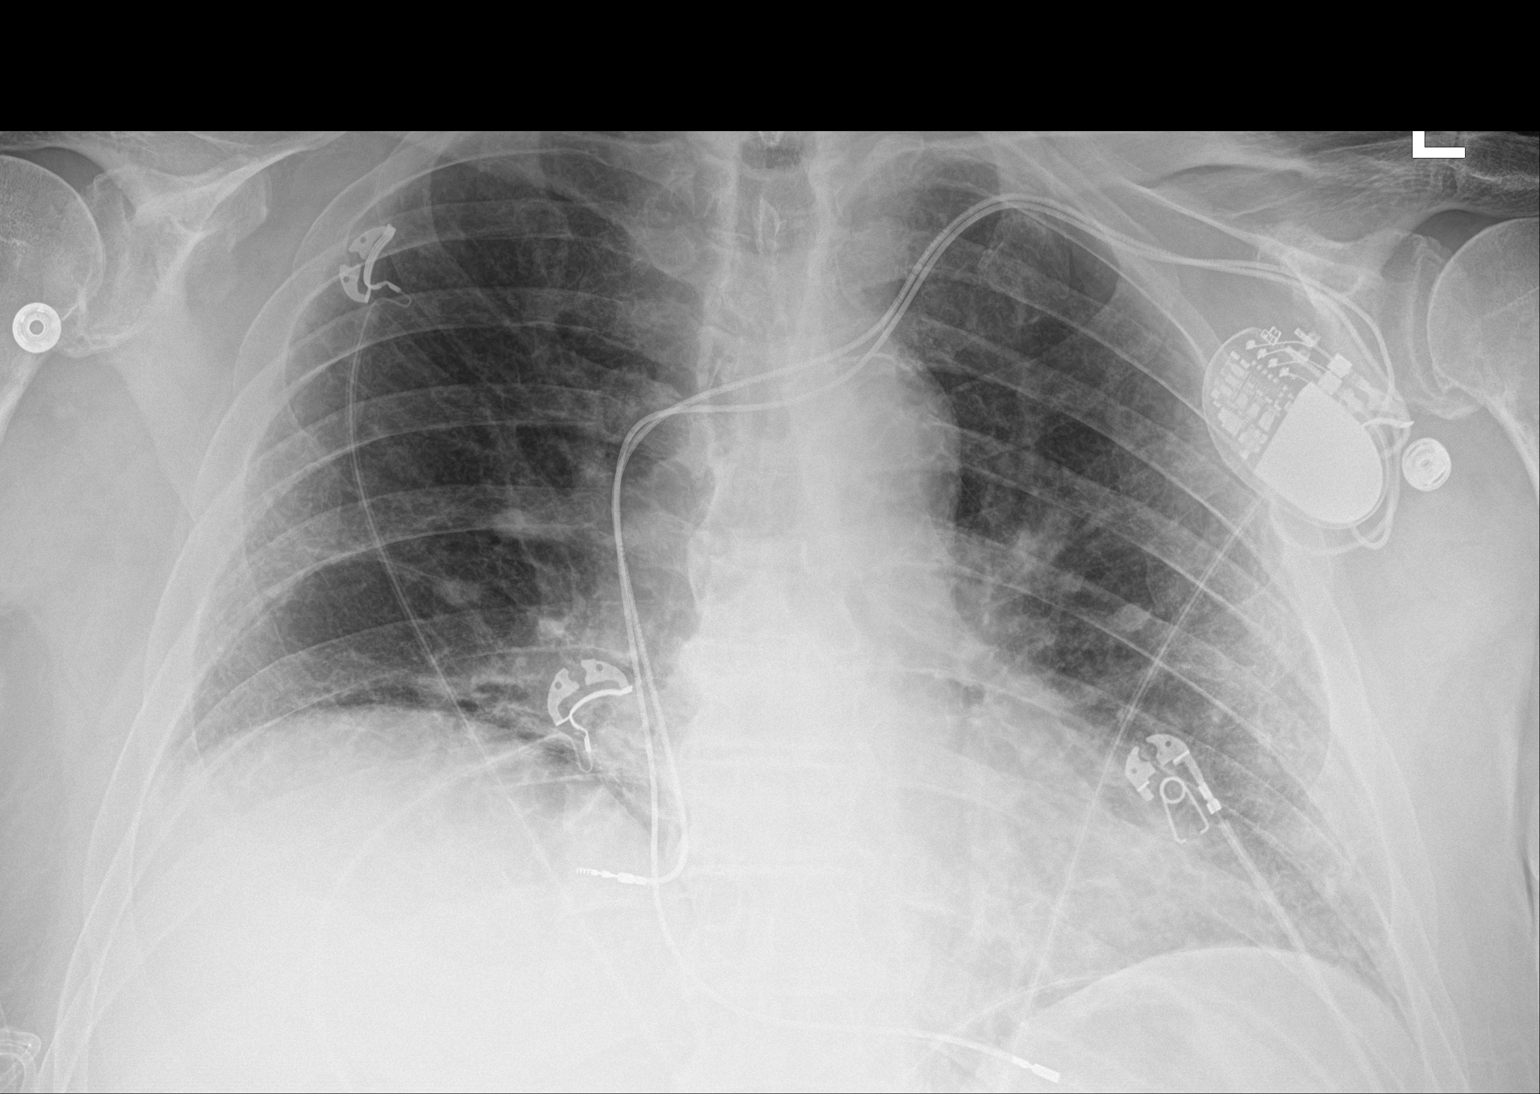

[1 of 1 positions shown; findings below may reference images not displayed]

FINDINGS: There is apparent fibrosis in portions of the mid and lower lung
zones. There is no frank edema or consolidation. Heart is upper
normal in size with pulmonary vascularity normal. Pacemaker leads
are attached the right atrium and right ventricle. No adenopathy.
There is aortic atherosclerosis. There is postoperative change in
the lower cervical region.
IMPRESSION: Areas of fibrosis in mid and lower lung zones bilaterally. No frank
edema or consolidation. Stable cardiac silhouette. Pacemaker leads
attached to right atrium and right ventricle. There is aortic
atherosclerosis.

Aortic Atherosclerosis (JL0S5-CG3.3).

## 2019-02-08 DIAGNOSIS — G43709 Chronic migraine without aura, not intractable, without status migrainosus: Secondary | ICD-10-CM | POA: Diagnosis not present

## 2019-02-08 DIAGNOSIS — M5030 Other cervical disc degeneration, unspecified cervical region: Secondary | ICD-10-CM | POA: Diagnosis not present

## 2019-02-08 DIAGNOSIS — M47812 Spondylosis without myelopathy or radiculopathy, cervical region: Secondary | ICD-10-CM | POA: Diagnosis not present

## 2019-02-08 DIAGNOSIS — M7918 Myalgia, other site: Secondary | ICD-10-CM | POA: Diagnosis not present

## 2019-02-08 DIAGNOSIS — Z79899 Other long term (current) drug therapy: Secondary | ICD-10-CM | POA: Diagnosis not present

## 2019-02-08 DIAGNOSIS — M47816 Spondylosis without myelopathy or radiculopathy, lumbar region: Secondary | ICD-10-CM | POA: Diagnosis not present

## 2019-02-11 ENCOUNTER — Other Ambulatory Visit: Payer: Self-pay | Admitting: Family Medicine

## 2019-02-12 ENCOUNTER — Telehealth: Payer: Self-pay | Admitting: Family Medicine

## 2019-02-12 ENCOUNTER — Ambulatory Visit (INDEPENDENT_AMBULATORY_CARE_PROVIDER_SITE_OTHER): Payer: Medicare Other | Admitting: Family Medicine

## 2019-02-12 ENCOUNTER — Encounter: Payer: Self-pay | Admitting: Family Medicine

## 2019-02-12 DIAGNOSIS — H1013 Acute atopic conjunctivitis, bilateral: Secondary | ICD-10-CM

## 2019-02-12 MED ORDER — GLUCOSE BLOOD VI STRP: 1.0000 | ORAL_STRIP | Freq: Four times a day (QID) | 12 refills | Status: DC

## 2019-02-12 MED ORDER — DICLOFENAC SODIUM 0.1 % OP SOLN: 1.0000 [drp] | Freq: Four times a day (QID) | OPHTHALMIC | 2 refills | Status: AC

## 2019-02-12 MED ORDER — GLUCOSE BLOOD VI STRP: ORAL_STRIP | 12 refills | Status: DC

## 2019-02-12 NOTE — Progress Notes (Signed)
Subjective:    Patient ID: Thomas Maldonado, male    DOB: 1933-09-09, 85 y.o.   MRN: [id number]   HPI: Thomas Maldonado is a 85 y.o. male presenting for itching at corners of eyes and dryness. Onset yesterday. No mttering. No redness. No change in vision.   Depression screen [facility] 2/9 11/02/2018 08/02/2018 06/29/2018 06/12/2018 05/08/2018  Decreased Interest 2 0 0 0 0  Down, Depressed, Hopeless 2 0 0 0 0  PHQ - 2 Score 4 0 0 0 0  Altered sleeping 0 - 0 - -  Tired, decreased energy 3 - 0 - -  Change in appetite 1 - 0 - -  Feeling bad or failure about yourself  2 - 0 - -  Trouble concentrating 1 - 0 - -  Moving slowly or fidgety/restless 0 - 0 - -  Suicidal thoughts 0 - 0 - -  PHQ-9 Score 11 - 0 - -  Difficult doing work/chores - - Not difficult at all - -     Relevant past medical, surgical, family and social history reviewed and updated as indicated.  Interim medical history since our last visit reviewed. Allergies and medications reviewed and updated.  ROS:  Review of Systems  Constitutional: Negative for activity change, appetite change, chills and fever.  HENT: Negative for congestion, ear discharge, ear pain, hearing loss, postnasal drip, rhinorrhea and sneezing.   Eyes: Positive for itching. Negative for photophobia, pain, discharge, redness and visual disturbance.  Respiratory: Negative for chest tightness and shortness of breath.   Skin: Negative for rash.     Social History   Tobacco Use  Smoking Status Former Smoker  . Types: Cigarettes  . Quit date: 11/02/1981  . Years since quitting: 37.3  Smokeless Tobacco Never Used       Objective:     Wt Readings from Last 3 Encounters:  08/02/18 224 lb 6 oz (101.8 kg)  06/29/18 227 lb (103 kg)  06/23/18 224 lb (101.6 kg)     Exam deferred. Pt. Harboring due to COVID 19. Phone visit performed.   Assessment & Plan:   1. Allergic conjunctivitis of both eyes     Meds ordered this encounter  Medications  . diclofenac  (VOLTAREN) 0.1 % ophthalmic solution    Sig: Place 1 drop into both eyes 4 (four) times daily.    Dispense:  5 mL    Refill:  2    No orders of the defined types were placed in this encounter.     Diagnoses and all orders for this visit:  Allergic conjunctivitis of both eyes  Other orders -     diclofenac (VOLTAREN) 0.1 % ophthalmic solution; Place 1 drop into both eyes 4 (four) times daily.    Virtual Visit via telephone Note  I discussed the limitations, risks, security and privacy concerns of performing an evaluation and management service by telephone and the availability of in person appointments. The patient was identified with two identifiers. Pt.expressed understanding and agreed to proceed. Pt. Is at home. Dr. Livia Snellen is in his office.  Follow Up Instructions:   I discussed the assessment and treatment plan with the patient. The patient was provided an opportunity to ask questions and all were answered. The patient agreed with the plan and demonstrated an understanding of the instructions.   The patient was advised to call back or seek an in-person evaluation if the symptoms worsen or if the condition fails to improve as anticipated.   Total  minutes including chart review and phone contact time: 5   Follow up plan: No follow-ups on file.  Claretta Fraise, MD [place]

## 2019-02-12 NOTE — Telephone Encounter (Signed)
Sent refill and pt wife aware

## 2019-02-22 ENCOUNTER — Ambulatory Visit: Payer: Medicare Other | Admitting: Family Medicine

## 2019-02-22 ENCOUNTER — Other Ambulatory Visit: Payer: Self-pay | Admitting: Family Medicine

## 2019-02-22 MED ORDER — OMEPRAZOLE 20 MG PO CPDR: 40.0000 mg | DELAYED_RELEASE_CAPSULE | Freq: Every day | ORAL | 0 refills | Status: DC

## 2019-02-22 NOTE — Telephone Encounter (Signed)
What is the name of the medication? Omeprazole 20mg    Have you contacted your pharmacy to request a refill? yes  Which pharmacy would you like this sent to? [facility] in [place].   Patient notified that their request is being sent to the clinical staff for review and that they should receive a call once it is complete. If they do not receive a call within 24 hours they can check with their pharmacy or our office.

## 2019-02-22 NOTE — Telephone Encounter (Signed)
Refill sent pt aware. 

## 2019-04-05 ENCOUNTER — Telehealth: Payer: Self-pay | Admitting: Family Medicine

## 2019-04-05 NOTE — Chronic Care Management (AMB) (Signed)
°  Chronic Care Management   Outreach Note  04/05/2019 Name: Thomas Maldonado MRN: [id number] DOB: 28-Mar-1934  Referred by: Claretta Fraise, MD Reason for referral : Chronic Care Management (Initial CCM outreach was unsuccessful.)   An unsuccessful telephone outreach was attempted today. The patient was referred to the case management team by for assistance with care management and care coordination.   Follow Up Plan: A HIPPA compliant phone message was left for the patient providing contact information and requesting a return call.  The care management team will reach out to the patient again over the next 7 days.  If patient returns call to provider office, please advise to call [place] [facility] [place] [address] [facility]  [place], [place] [address] Direct Dial: [email or web address]  Website: [email or web address]

## 2019-04-10 DIAGNOSIS — M5030 Other cervical disc degeneration, unspecified cervical region: Secondary | ICD-10-CM | POA: Diagnosis not present

## 2019-04-10 DIAGNOSIS — Z79899 Other long term (current) drug therapy: Secondary | ICD-10-CM | POA: Diagnosis not present

## 2019-04-10 DIAGNOSIS — M7918 Myalgia, other site: Secondary | ICD-10-CM | POA: Diagnosis not present

## 2019-04-10 DIAGNOSIS — M479 Spondylosis, unspecified: Secondary | ICD-10-CM | POA: Diagnosis not present

## 2019-04-10 DIAGNOSIS — M7912 Myalgia of auxiliary muscles, head and neck: Secondary | ICD-10-CM | POA: Diagnosis not present

## 2019-04-10 NOTE — Chronic Care Management (AMB) (Signed)
Chronic Care Management   Note  04/10/2019 Name: Thomas Maldonado MRN: [id number] DOB: 04/25/1934  Thomas Maldonado is a 85 y.o. year old male who is a primary care patient of Stacks, Cletus Gash, MD. I reached out to Fawn Kirk by phone today in response to a referral sent by Thomas Maldonado health plan.     Thomas Maldonado was given information about Chronic Care Management services today including:  1. CCM service includes personalized support from designated clinical staff supervised by his physician, including individualized plan of care and coordination with other care providers 2. 24/7 contact phone numbers for assistance for urgent and routine care needs. 3. Service will only be billed when office clinical staff spend 20 minutes or more in a month to coordinate care. 4. Only one practitioner may furnish and bill the service in a calendar month. 5. The patient may stop CCM services at any time (effective at the end of the month) by phone call to the office staff. 6. The patient will be responsible for cost sharing (co-pay) of up to 20% of the service fee (after annual deductible is met).  Patient did not agree to enrollment in care management services and does not wish to consider at this time.  Follow up plan: The patient has been provided with contact information for the chronic care management team and has been advised to call with any health related questions or concerns.   [place], [place] [address] Direct Dial: [email or web address]  Website: [email or web address]

## 2019-05-01 DIAGNOSIS — I441 Atrioventricular block, second degree: Secondary | ICD-10-CM | POA: Diagnosis not present

## 2019-05-07 ENCOUNTER — Other Ambulatory Visit: Payer: Self-pay

## 2019-05-07 ENCOUNTER — Other Ambulatory Visit: Payer: Medicare Other

## 2019-05-07 DIAGNOSIS — I10 Essential (primary) hypertension: Secondary | ICD-10-CM

## 2019-05-07 DIAGNOSIS — Z794 Long term (current) use of insulin: Secondary | ICD-10-CM | POA: Diagnosis not present

## 2019-05-07 DIAGNOSIS — E7800 Pure hypercholesterolemia, unspecified: Secondary | ICD-10-CM

## 2019-05-07 DIAGNOSIS — E1165 Type 2 diabetes mellitus with hyperglycemia: Secondary | ICD-10-CM | POA: Diagnosis not present

## 2019-05-07 LAB — BAYER DCA HB A1C WAIVED: HB A1C (BAYER DCA - WAIVED): 7.9 % — ABNORMAL HIGH (ref <7.0)

## 2019-05-08 ENCOUNTER — Ambulatory Visit (INDEPENDENT_AMBULATORY_CARE_PROVIDER_SITE_OTHER): Payer: Medicare Other | Admitting: Family Medicine

## 2019-05-08 ENCOUNTER — Encounter: Payer: Self-pay | Admitting: Family Medicine

## 2019-05-08 DIAGNOSIS — I10 Essential (primary) hypertension: Secondary | ICD-10-CM | POA: Diagnosis not present

## 2019-05-08 DIAGNOSIS — E1149 Type 2 diabetes mellitus with other diabetic neurological complication: Secondary | ICD-10-CM | POA: Diagnosis not present

## 2019-05-08 DIAGNOSIS — E1140 Type 2 diabetes mellitus with diabetic neuropathy, unspecified: Secondary | ICD-10-CM | POA: Diagnosis not present

## 2019-05-08 LAB — CMP14+EGFR
ALT: 15 IU/L (ref 0–44)
AST: 20 IU/L (ref 0–40)
Albumin/Globulin Ratio: 1.6 (ref 1.2–2.2)
Albumin: 4.2 g/dL (ref 3.6–4.6)
Alkaline Phosphatase: 126 IU/L — ABNORMAL HIGH (ref 39–117)
BUN/Creatinine Ratio: 12 (ref 10–24)
BUN: 18 mg/dL (ref 8–27)
Bilirubin Total: 0.3 mg/dL (ref 0.0–1.2)
CO2: 24 mmol/L (ref 20–29)
Calcium: 9 mg/dL (ref 8.6–10.2)
Chloride: 102 mmol/L (ref 96–106)
Creatinine, Ser: 1.52 mg/dL — ABNORMAL HIGH (ref 0.76–1.27)
GFR calc Af Amer: 48 mL/min/{1.73_m2} — ABNORMAL LOW (ref >59)
GFR calc non Af Amer: 42 mL/min/{1.73_m2} — ABNORMAL LOW (ref >59)
Globulin, Total: 2.7 g/dL (ref 1.5–4.5)
Glucose: 223 mg/dL — ABNORMAL HIGH (ref 65–99)
Potassium: 4.7 mmol/L (ref 3.5–5.2)
Sodium: 141 mmol/L (ref 134–144)
Total Protein: 6.9 g/dL (ref 6.0–8.5)

## 2019-05-08 LAB — CBC WITH DIFFERENTIAL/PLATELET
Basophils Absolute: 0.1 10*3/uL (ref 0.0–0.2)
Basos: 1 %
EOS (ABSOLUTE): 2.4 10*3/uL — ABNORMAL HIGH (ref 0.0–0.4)
Eos: 22 %
Hematocrit: 43.5 % (ref 37.5–51.0)
Hemoglobin: 14.5 g/dL (ref 13.0–17.7)
Immature Grans (Abs): 0 10*3/uL (ref 0.0–0.1)
Immature Granulocytes: 0 %
Lymphocytes Absolute: 2.5 10*3/uL (ref 0.7–3.1)
Lymphs: 22 %
MCH: 31.3 pg (ref 26.6–33.0)
MCHC: 33.3 g/dL (ref 31.5–35.7)
MCV: 94 fL (ref 79–97)
Monocytes Absolute: 0.8 10*3/uL (ref 0.1–0.9)
Monocytes: 7 %
Neutrophils Absolute: 5.3 10*3/uL (ref 1.4–7.0)
Neutrophils: 48 %
Platelets: 140 10*3/uL — ABNORMAL LOW (ref 150–450)
RBC: 4.64 x10E6/uL (ref 4.14–5.80)
RDW: 12.5 % (ref 11.6–15.4)
WBC: 11.1 10*3/uL — ABNORMAL HIGH (ref 3.4–10.8)

## 2019-05-08 LAB — LIPID PANEL
Chol/HDL Ratio: 2.9 ratio (ref 0.0–5.0)
Cholesterol, Total: 109 mg/dL (ref 100–199)
HDL: 37 mg/dL — ABNORMAL LOW (ref >39)
LDL Chol Calc (NIH): 46 mg/dL (ref 0–99)
Triglycerides: 152 mg/dL — ABNORMAL HIGH (ref 0–149)
VLDL Cholesterol Cal: 26 mg/dL (ref 5–40)

## 2019-05-08 MED ORDER — BLOOD GLUCOSE MONITOR KIT: PACK | 0 refills | Status: DC

## 2019-05-08 NOTE — Progress Notes (Signed)
Subjective:    Patient ID: Thomas Maldonado, male    DOB: 01-Feb-1934, 85 y.o.   MRN: [id number]   HPI: Thomas Maldonado is a 85 y.o. male presenting for presents forFollow-up of diabetes. Patient checks blood sugar at home.   100-130 fasting and not checking postprandial. Has gone to 500 a couple of times. Concerned the meter is shot. Trying to cut back on carbs.  Patient denies symptoms such as polyuria, polydipsia, excessive hunger, nausea No significant hypoglycemic spells noted. Medications reviewed. Pt reports taking them regularly without complication/adverse reaction being reported today.  Checking feet daily.     Depression screen [facility] 2/9 11/02/2018 08/02/2018 06/29/2018 06/12/2018 05/08/2018  Decreased Interest 2 0 0 0 0  Down, Depressed, Hopeless 2 0 0 0 0  PHQ - 2 Score 4 0 0 0 0  Altered sleeping 0 - 0 - -  Tired, decreased energy 3 - 0 - -  Change in appetite 1 - 0 - -  Feeling bad or failure about yourself  2 - 0 - -  Trouble concentrating 1 - 0 - -  Moving slowly or fidgety/restless 0 - 0 - -  Suicidal thoughts 0 - 0 - -  PHQ-9 Score 11 - 0 - -  Difficult doing work/chores - - Not difficult at all - -     Relevant past medical, surgical, family and social history reviewed and updated as indicated.  Interim medical history since our last visit reviewed. Allergies and medications reviewed and updated.  ROS:  Review of Systems  Constitutional: Negative.   HENT: Negative.   Eyes: Negative for visual disturbance.  Respiratory: Negative for cough and shortness of breath.   Cardiovascular: Negative for chest pain and leg swelling.  Gastrointestinal: Negative for abdominal pain, diarrhea, nausea and vomiting.  Genitourinary: Negative for difficulty urinating.  Musculoskeletal: Negative for arthralgias and myalgias.  Skin: Negative for pallor and rash.  Neurological: Negative for headaches.  Psychiatric/Behavioral: Negative for sleep disturbance.     Social History    Tobacco Use  Smoking Status Former Smoker  . Types: Cigarettes  . Quit date: 11/02/1981  . Years since quitting: 37.5  Smokeless Tobacco Never Used       Objective:     Wt Readings from Last 3 Encounters:  08/02/18 224 lb 6 oz (101.8 kg)  06/29/18 227 lb (103 kg)  06/23/18 224 lb (101.6 kg)     Exam deferred. Pt. Harboring due to COVID 19. Phone visit performed.   Assessment & Plan:   1. Diabetic neuropathy with neurologic complication ([place])   2. Essential hypertension     Meds ordered this encounter  Medications  . blood glucose meter kit and supplies KIT    Sig: Dispense based on patient and insurance preference. Use up to four times daily as directed. (FOR ICD-9 250.00, 250.01).    Dispense:  1 each    Refill:  0    Order Specific Question:   Number of strips    Answer:   100    Order Specific Question:   Number of lancets    Answer:   100    No orders of the defined types were placed in this encounter.     Diagnoses and all orders for this visit:  Diabetic neuropathy with neurologic complication ([place])  Essential hypertension  Other orders -     blood glucose meter kit and supplies KIT; Dispense based on patient and insurance preference. Use up to four  times daily as directed. (FOR ICD-9 250.00, 250.01).    Virtual Visit via telephone Note  I discussed the limitations, risks, security and privacy concerns of performing an evaluation and management service by telephone and the availability of in person appointments. The patient was identified with two identifiers. Pt.expressed understanding and agreed to proceed. Pt. Is at home. Dr. Livia Snellen is in his office.  Follow Up Instructions:   I discussed the assessment and treatment plan with the patient. The patient was provided an opportunity to ask questions and all were answered. The patient agreed with the plan and demonstrated an understanding of the instructions.   The patient was advised to call back  or seek an in-person evaluation if the symptoms worsen or if the condition fails to improve as anticipated.   Total minutes including chart review and phone contact time: 19   Follow up plan: Return in about 3 months (around 08/06/2019).  Claretta Fraise, MD [place]

## 2019-05-09 DIAGNOSIS — M47812 Spondylosis without myelopathy or radiculopathy, cervical region: Secondary | ICD-10-CM | POA: Diagnosis not present

## 2019-05-09 DIAGNOSIS — M7918 Myalgia, other site: Secondary | ICD-10-CM | POA: Diagnosis not present

## 2019-05-09 DIAGNOSIS — M47816 Spondylosis without myelopathy or radiculopathy, lumbar region: Secondary | ICD-10-CM | POA: Diagnosis not present

## 2019-05-09 DIAGNOSIS — M5030 Other cervical disc degeneration, unspecified cervical region: Secondary | ICD-10-CM | POA: Diagnosis not present

## 2019-05-09 DIAGNOSIS — Z79899 Other long term (current) drug therapy: Secondary | ICD-10-CM | POA: Diagnosis not present

## 2019-05-10 ENCOUNTER — Other Ambulatory Visit: Payer: Self-pay

## 2019-05-10 MED ORDER — BLOOD GLUCOSE MONITOR KIT: PACK | 0 refills | Status: AC

## 2019-05-13 DIAGNOSIS — Z955 Presence of coronary angioplasty implant and graft: Secondary | ICD-10-CM | POA: Diagnosis not present

## 2019-05-13 DIAGNOSIS — R079 Chest pain, unspecified: Secondary | ICD-10-CM | POA: Diagnosis not present

## 2019-05-13 DIAGNOSIS — J811 Chronic pulmonary edema: Secondary | ICD-10-CM | POA: Diagnosis not present

## 2019-05-13 DIAGNOSIS — Z7982 Long term (current) use of aspirin: Secondary | ICD-10-CM | POA: Diagnosis not present

## 2019-05-13 DIAGNOSIS — R0789 Other chest pain: Secondary | ICD-10-CM | POA: Diagnosis not present

## 2019-05-13 DIAGNOSIS — R519 Headache, unspecified: Secondary | ICD-10-CM | POA: Diagnosis not present

## 2019-05-13 DIAGNOSIS — K5730 Diverticulosis of large intestine without perforation or abscess without bleeding: Secondary | ICD-10-CM | POA: Diagnosis not present

## 2019-05-13 DIAGNOSIS — Z794 Long term (current) use of insulin: Secondary | ICD-10-CM | POA: Diagnosis not present

## 2019-05-13 DIAGNOSIS — I10 Essential (primary) hypertension: Secondary | ICD-10-CM | POA: Diagnosis not present

## 2019-05-13 DIAGNOSIS — E1165 Type 2 diabetes mellitus with hyperglycemia: Secondary | ICD-10-CM | POA: Diagnosis not present

## 2019-05-13 DIAGNOSIS — Z743 Need for continuous supervision: Secondary | ICD-10-CM | POA: Diagnosis not present

## 2019-05-13 DIAGNOSIS — G9389 Other specified disorders of brain: Secondary | ICD-10-CM | POA: Diagnosis not present

## 2019-05-13 DIAGNOSIS — Z950 Presence of cardiac pacemaker: Secondary | ICD-10-CM | POA: Diagnosis not present

## 2019-05-13 DIAGNOSIS — R531 Weakness: Secondary | ICD-10-CM | POA: Diagnosis not present

## 2019-05-13 DIAGNOSIS — E119 Type 2 diabetes mellitus without complications: Secondary | ICD-10-CM | POA: Diagnosis not present

## 2019-05-13 DIAGNOSIS — Z79899 Other long term (current) drug therapy: Secondary | ICD-10-CM | POA: Diagnosis not present

## 2019-05-13 DIAGNOSIS — R112 Nausea with vomiting, unspecified: Secondary | ICD-10-CM | POA: Diagnosis not present

## 2019-05-13 DIAGNOSIS — I2510 Atherosclerotic heart disease of native coronary artery without angina pectoris: Secondary | ICD-10-CM | POA: Diagnosis not present

## 2019-05-15 ENCOUNTER — Other Ambulatory Visit: Payer: Self-pay | Admitting: Family Medicine

## 2019-06-06 DIAGNOSIS — Z79899 Other long term (current) drug therapy: Secondary | ICD-10-CM | POA: Diagnosis not present

## 2019-06-12 ENCOUNTER — Telehealth: Payer: Self-pay | Admitting: Family Medicine

## 2019-06-12 NOTE — Telephone Encounter (Signed)
Pts daughter called stating that we sent over the wrong blood strips Rx to the pharmacy. Say patient needs the One Touch Strips that will work with his new blood glucose machine. Pt does not have any strips that he can use currently and is supposed to check his sugar 4x each day. Wants to speak with nurse asap.

## 2019-06-13 MED ORDER — GLUCOSE BLOOD VI STRP: ORAL_STRIP | 12 refills | Status: DC

## 2019-06-13 NOTE — Telephone Encounter (Signed)
Sent new rx in for pt's strips and pt daughter aware.

## 2019-06-15 ENCOUNTER — Other Ambulatory Visit: Payer: Self-pay | Admitting: Family Medicine

## 2019-06-23 ENCOUNTER — Other Ambulatory Visit: Payer: Self-pay | Admitting: Family Medicine

## 2019-06-25 ENCOUNTER — Other Ambulatory Visit: Payer: Self-pay

## 2019-06-26 ENCOUNTER — Ambulatory Visit (INDEPENDENT_AMBULATORY_CARE_PROVIDER_SITE_OTHER): Payer: Medicare Other | Admitting: Family Medicine

## 2019-06-26 ENCOUNTER — Encounter: Payer: Self-pay | Admitting: Family Medicine

## 2019-06-26 VITALS — BP 136/80 | HR 65 | Temp 96.9°F | Ht 72.0 in | Wt 230.4 lb

## 2019-06-26 DIAGNOSIS — E782 Mixed hyperlipidemia: Secondary | ICD-10-CM

## 2019-06-26 DIAGNOSIS — E559 Vitamin D deficiency, unspecified: Secondary | ICD-10-CM

## 2019-06-26 DIAGNOSIS — K219 Gastro-esophageal reflux disease without esophagitis: Secondary | ICD-10-CM

## 2019-06-26 DIAGNOSIS — I25118 Atherosclerotic heart disease of native coronary artery with other forms of angina pectoris: Secondary | ICD-10-CM

## 2019-06-26 DIAGNOSIS — E1140 Type 2 diabetes mellitus with diabetic neuropathy, unspecified: Secondary | ICD-10-CM

## 2019-06-26 DIAGNOSIS — E1149 Type 2 diabetes mellitus with other diabetic neurological complication: Secondary | ICD-10-CM

## 2019-06-26 DIAGNOSIS — I10 Essential (primary) hypertension: Secondary | ICD-10-CM

## 2019-06-26 MED ORDER — GLIPIZIDE 10 MG PO TABS: ORAL_TABLET | ORAL | 1 refills | Status: DC

## 2019-06-26 MED ORDER — INSULIN REGULAR HUMAN 100 UNIT/ML IJ SOLN: 10.0000 [IU] | Freq: Two times a day (BID) | INTRAMUSCULAR | 1 refills | Status: DC

## 2019-06-26 MED ORDER — OMEPRAZOLE 20 MG PO CPDR: 40.0000 mg | DELAYED_RELEASE_CAPSULE | Freq: Every day | ORAL | 1 refills | Status: AC

## 2019-06-26 MED ORDER — LISINOPRIL 10 MG PO TABS: 10.0000 mg | ORAL_TABLET | Freq: Every day | ORAL | Status: AC

## 2019-06-26 MED ORDER — METOPROLOL TARTRATE 25 MG PO TABS: ORAL_TABLET | ORAL | 1 refills | Status: AC

## 2019-06-26 MED ORDER — PRAVASTATIN SODIUM 20 MG PO TABS: 20.0000 mg | ORAL_TABLET | Freq: Every day | ORAL | 3 refills | Status: AC

## 2019-06-26 MED ORDER — TOPIRAMATE 25 MG PO TABS: 25.0000 mg | ORAL_TABLET | Freq: Every day | ORAL | 1 refills | Status: AC

## 2019-06-26 MED ORDER — VITAMIN D (ERGOCALCIFEROL) 1.25 MG (50000 UNIT) PO CAPS: 50000.0000 [IU] | ORAL_CAPSULE | ORAL | Status: AC

## 2019-06-26 MED ORDER — CELECOXIB 200 MG PO CAPS: 200.0000 mg | ORAL_CAPSULE | Freq: Every day | ORAL | 3 refills | Status: DC

## 2019-06-26 MED ORDER — LEVEMIR FLEXTOUCH 100 UNIT/ML ~~LOC~~ SOPN: PEN_INJECTOR | SUBCUTANEOUS | 0 refills | Status: DC

## 2019-06-26 MED ORDER — EZETIMIBE 10 MG PO TABS: 10.0000 mg | ORAL_TABLET | Freq: Every day | ORAL | 1 refills | Status: AC

## 2019-06-26 NOTE — Progress Notes (Signed)
Subjective:  Patient ID: Thomas Maldonado,  male    DOB: 15-Sep-1933  Age: 86 y.o.    CC: Medication Check   HPI Thomas Maldonado presents for  follow-up of hypertension. Patient has no history of headache chest pain or shortness of breath or recent cough. Patient also denies symptoms of TIA such as numbness weakness lateralizing. Patient denies side effects from medication. States taking it regularly.  Patient also  in for follow-up of elevated cholesterol. Doing well without complaints on current medication. Denies side effects  including myalgia and arthralgia and nausea. Also in today for liver function testing. Currently no chest pain, shortness of breath or other cardiovascular related symptoms noted.  Follow-up of diabetes. Patient does check blood sugar at home. No log returned. Patient denies symptoms such as excessive hunger or urinary frequency, excessive hunger, nausea No significant hypoglycemic spells noted. Medications reviewed. Pt reports taking them regularly. Pt. denies complication/adverse reaction today.    History Thomas Maldonado has a past medical history of Acute upper respiratory infections of unspecified site, AV block, CAD (coronary artery disease), Chronic kidney disease, stage III (moderate), Coronary atherosclerosis of unspecified type of vessel, native or graft (1999), Diabetes mellitus without complication ([place]), Esophageal reflux, Hypertension, Nausea, NSVT (nonsustained ventricular tachycardia) ([place]), Other chronic pain, Problems with hearing, Problems with sight, Pure hypercholesterolemia, and Type II or unspecified type diabetes mellitus with renal manifestations, not stated as uncontrolled(250.40).   He has a past surgical history that includes Neck surgery (2008); Shoulder surgery (2000); Knee arthroscopy (Right, 2000); Transurethral prostatectomy with gyrus instruments (1999); Foot surgery (Left, 1982); Hernia repair; Colon surgery; Pacemaker insertion (2014); and LEFT  HEART CATH AND CORONARY ANGIOGRAPHY (N/A, 05/26/2018).   His family history includes Cancer in his brother, brother, brother, brother, brother, brother, sister, sister, sister, and sister; Diabetes in his brother, brother, brother, father, sister, and sister; Heart disease in his brother, brother, brother, brother, brother, brother, sister, sister, sister, and sister.He reports that he quit smoking about 37 years ago. His smoking use included cigarettes. He has never used smokeless tobacco. He reports that he does not drink alcohol or use drugs.  Current Outpatient Medications on File Prior to Visit  Medication Sig Dispense Refill   acetaminophen (TYLENOL) 500 MG tablet Take 500-1,000 mg by mouth every 6 (six) hours as needed (FOR HEADACHES).     aspirin 81 MG tablet Take 81 mg by mouth every evening.      azelastine (ASTELIN) 0.1 % nasal spray Place 2 sprays into both nostrils 2 (two) times daily. Use in each nostril as directed 30 mL 12   blood glucose meter kit and supplies KIT Dispense based on patient and insurance preference. Use up to four times daily as directed. (FOR ICD-9 250.00, 250.01). 1 each 0   diclofenac (VOLTAREN) 0.1 % ophthalmic solution Place 1 drop into both eyes 4 (four) times daily. 5 mL 2   glucose blood test strip Test BS QID and PRN 100 each 12   Lancets (ACCU-CHEK SOFT TOUCH) lancets Use as instructed 100 each 12   meclizine (ANTIVERT) 25 MG tablet Take 1 tablet (25 mg total) by mouth every 6 (six) hours as needed for dizziness or nausea. 30 tablet 0   ondansetron (ZOFRAN-ODT) 4 MG disintegrating tablet Take 1 tablet (4 mg total) by mouth every 8 (eight) hours as needed for nausea or vomiting. 90 tablet 2   oxyCODONE-acetaminophen (PERCOCET) 10-325 MG tablet Take 1 tablet by mouth every 6 (six) hours as needed for  pain.   0   No current facility-administered medications on file prior to visit.    ROS Review of Systems  Constitutional: Negative.   HENT:  Negative.   Eyes: Negative for visual disturbance.  Respiratory: Negative for cough and shortness of breath.   Cardiovascular: Negative for chest pain and leg swelling.  Gastrointestinal: Positive for nausea. Negative for abdominal pain, diarrhea and vomiting.  Genitourinary: Negative for difficulty urinating.  Musculoskeletal: Negative for arthralgias and myalgias.  Skin: Negative for rash.  Neurological: Negative for headaches.  Psychiatric/Behavioral: Negative for sleep disturbance.    Objective:  BP 136/80    Pulse 65    Temp (!) 96.9 F (36.1 C) (Temporal)    Ht 6' (1.829 m)    Wt 230 lb 6.4 oz (104.5 kg)    BMI 31.25 kg/m   BP Readings from Last 3 Encounters:  06/26/19 136/80  08/02/18 137/79  06/29/18 (!) 153/78    Wt Readings from Last 3 Encounters:  06/26/19 230 lb 6.4 oz (104.5 kg)  08/02/18 224 lb 6 oz (101.8 kg)  06/29/18 227 lb (103 kg)     Physical Exam Constitutional:      General: He is not in acute distress.    Appearance: He is well-developed.  HENT:     Head: Normocephalic and atraumatic.     Right Ear: External ear normal.     Left Ear: External ear normal.     Nose: Nose normal.  Eyes:     Conjunctiva/sclera: Conjunctivae normal.     Pupils: Pupils are equal, round, and reactive to light.  Cardiovascular:     Rate and Rhythm: Normal rate and regular rhythm.     Heart sounds: Normal heart sounds. No murmur.  Pulmonary:     Effort: Pulmonary effort is normal. No respiratory distress.     Breath sounds: Normal breath sounds. No wheezing or rales.  Abdominal:     Palpations: Abdomen is soft.     Tenderness: There is no abdominal tenderness.  Musculoskeletal:        General: Normal range of motion.     Cervical back: Normal range of motion and neck supple.  Skin:    General: Skin is warm and dry.  Neurological:     Mental Status: He is alert and oriented to person, place, and time.     Deep Tendon Reflexes: Reflexes are normal and symmetric.    Psychiatric:        Behavior: Behavior normal.        Thought Content: Thought content normal.        Judgment: Judgment normal.     Diabetic Foot Exam - Simple   No data filed        Assessment & Plan:   Thomas Maldonado was seen today for medication check.  Diagnoses and all orders for this visit:  Diabetic neuropathy with neurologic complication (HCC) -     glipiZIDE (GLUCOTROL) 10 MG tablet; TAKE 1 TABLET BY MOUTH TWICE DAILY BEFORE A MEAL -     insulin regular (HUMULIN R) 100 units/mL injection; Inject 0.1 mLs (10 Units total) into the skin 2 (two) times daily before a meal. -     Insulin Detemir (LEVEMIR FLEXTOUCH) 100 UNIT/ML Pen; INJECT 60 UNITS SUBCUTANEOUSLY ONCE DAILY -     lisinopril (ZESTRIL) 10 MG tablet; Take 1 tablet (10 mg total) by mouth daily.  Essential hypertension -     metoprolol tartrate (LOPRESSOR) 25 MG tablet; Take 1/2 (one-half)  tablet by mouth twice daily  Coronary artery disease of native heart with stable angina pectoris, unspecified vessel or lesion type (HCC)  Mixed hyperlipidemia -     ezetimibe (ZETIA) 10 MG tablet; Take 1 tablet (10 mg total) by mouth daily. -     pravastatin (PRAVACHOL) 20 MG tablet; Take 1 tablet (20 mg total) by mouth daily.  Gastroesophageal reflux disease without esophagitis -     omeprazole (PRILOSEC) 20 MG capsule; Take 2 capsules (40 mg total) by mouth daily.  Vitamin D deficiency -     Vitamin D, Ergocalciferol, (DRISDOL) 1.25 MG (50000 UNIT) CAPS capsule; Take 1 capsule (50,000 Units total) by mouth every Thursday.  Other orders -     celecoxib (CELEBREX) 200 MG capsule; Take 1 capsule (200 mg total) by mouth daily. -     topiramate (TOPAMAX) 25 MG tablet; Take 1 tablet (25 mg total) by mouth daily.   I have changed Jeralyn Ruths Chestnut's ezetimibe, Levemir FlexTouch, lisinopril, omeprazole, pravastatin, and Vitamin D (Ergocalciferol). I am also having him maintain his aspirin, oxyCODONE-acetaminophen, accu-chek soft touch,  acetaminophen, meclizine, ondansetron, azelastine, diclofenac, blood glucose meter kit and supplies, glucose blood, celecoxib, glipiZIDE, insulin regular, metoprolol tartrate, and topiramate.  Meds ordered this encounter  Medications   celecoxib (CELEBREX) 200 MG capsule    Sig: Take 1 capsule (200 mg total) by mouth daily.    Dispense:  90 capsule    Refill:  3   ezetimibe (ZETIA) 10 MG tablet    Sig: Take 1 tablet (10 mg total) by mouth daily.    Dispense:  90 tablet    Refill:  1   glipiZIDE (GLUCOTROL) 10 MG tablet    Sig: TAKE 1 TABLET BY MOUTH TWICE DAILY BEFORE A MEAL    Dispense:  180 tablet    Refill:  1   insulin regular (HUMULIN R) 100 units/mL injection    Sig: Inject 0.1 mLs (10 Units total) into the skin 2 (two) times daily before a meal.    Dispense:  30 mL    Refill:  1   Insulin Detemir (LEVEMIR FLEXTOUCH) 100 UNIT/ML Pen    Sig: INJECT 60 UNITS SUBCUTANEOUSLY ONCE DAILY    Dispense:  30 mL    Refill:  0   lisinopril (ZESTRIL) 10 MG tablet    Sig: Take 1 tablet (10 mg total) by mouth daily.    Dispense:      metoprolol tartrate (LOPRESSOR) 25 MG tablet    Sig: Take 1/2 (one-half) tablet by mouth twice daily    Dispense:  180 tablet    Refill:  1   omeprazole (PRILOSEC) 20 MG capsule    Sig: Take 2 capsules (40 mg total) by mouth daily.    Dispense:  180 capsule    Refill:  1   pravastatin (PRAVACHOL) 20 MG tablet    Sig: Take 1 tablet (20 mg total) by mouth daily.    Dispense:  90 tablet    Refill:  3   topiramate (TOPAMAX) 25 MG tablet    Sig: Take 1 tablet (25 mg total) by mouth daily.    Dispense:  90 tablet    Refill:  1   Vitamin D, Ergocalciferol, (DRISDOL) 1.25 MG (50000 UNIT) CAPS capsule    Sig: Take 1 capsule (50,000 Units total) by mouth every Thursday.    Dispense:  5 capsule     Follow-up: Return in about 3 months (around 09/23/2019).  Claretta Fraise, M.D.

## 2019-07-11 DIAGNOSIS — R42 Dizziness and giddiness: Secondary | ICD-10-CM | POA: Diagnosis not present

## 2019-07-11 DIAGNOSIS — M479 Spondylosis, unspecified: Secondary | ICD-10-CM | POA: Diagnosis not present

## 2019-07-11 DIAGNOSIS — Z79899 Other long term (current) drug therapy: Secondary | ICD-10-CM | POA: Diagnosis not present

## 2019-07-11 DIAGNOSIS — M7912 Myalgia of auxiliary muscles, head and neck: Secondary | ICD-10-CM | POA: Diagnosis not present

## 2019-07-11 DIAGNOSIS — H905 Unspecified sensorineural hearing loss: Secondary | ICD-10-CM | POA: Diagnosis not present

## 2019-07-11 DIAGNOSIS — M5030 Other cervical disc degeneration, unspecified cervical region: Secondary | ICD-10-CM | POA: Diagnosis not present

## 2019-08-08 DIAGNOSIS — R42 Dizziness and giddiness: Secondary | ICD-10-CM | POA: Diagnosis not present

## 2019-08-08 DIAGNOSIS — M7912 Myalgia of auxiliary muscles, head and neck: Secondary | ICD-10-CM | POA: Diagnosis not present

## 2019-08-08 DIAGNOSIS — Z79899 Other long term (current) drug therapy: Secondary | ICD-10-CM | POA: Diagnosis not present

## 2019-08-08 DIAGNOSIS — M479 Spondylosis, unspecified: Secondary | ICD-10-CM | POA: Diagnosis not present

## 2019-08-08 DIAGNOSIS — M5030 Other cervical disc degeneration, unspecified cervical region: Secondary | ICD-10-CM | POA: Diagnosis not present

## 2019-08-08 DIAGNOSIS — G43709 Chronic migraine without aura, not intractable, without status migrainosus: Secondary | ICD-10-CM | POA: Diagnosis not present

## 2019-09-05 DIAGNOSIS — M479 Spondylosis, unspecified: Secondary | ICD-10-CM | POA: Diagnosis not present

## 2019-09-05 DIAGNOSIS — M7912 Myalgia of auxiliary muscles, head and neck: Secondary | ICD-10-CM | POA: Diagnosis not present

## 2019-09-05 DIAGNOSIS — Z79899 Other long term (current) drug therapy: Secondary | ICD-10-CM | POA: Diagnosis not present

## 2019-09-05 DIAGNOSIS — M5030 Other cervical disc degeneration, unspecified cervical region: Secondary | ICD-10-CM | POA: Diagnosis not present

## 2019-09-05 DIAGNOSIS — G43709 Chronic migraine without aura, not intractable, without status migrainosus: Secondary | ICD-10-CM | POA: Diagnosis not present

## 2019-09-23 ENCOUNTER — Other Ambulatory Visit: Payer: Self-pay | Admitting: Family Medicine

## 2019-09-23 DIAGNOSIS — E1140 Type 2 diabetes mellitus with diabetic neuropathy, unspecified: Secondary | ICD-10-CM

## 2019-09-23 DIAGNOSIS — E1149 Type 2 diabetes mellitus with other diabetic neurological complication: Secondary | ICD-10-CM

## 2019-09-24 ENCOUNTER — Ambulatory Visit: Payer: Medicare Other | Admitting: Family Medicine

## 2019-09-25 ENCOUNTER — Ambulatory Visit (INDEPENDENT_AMBULATORY_CARE_PROVIDER_SITE_OTHER): Payer: Medicare Other | Admitting: Family Medicine

## 2019-09-25 ENCOUNTER — Encounter: Payer: Self-pay | Admitting: Family Medicine

## 2019-09-25 ENCOUNTER — Other Ambulatory Visit: Payer: Self-pay

## 2019-09-25 VITALS — BP 130/79 | HR 60 | Temp 97.1°F | Ht 72.0 in | Wt 231.2 lb

## 2019-09-25 DIAGNOSIS — I10 Essential (primary) hypertension: Secondary | ICD-10-CM

## 2019-09-25 DIAGNOSIS — I25118 Atherosclerotic heart disease of native coronary artery with other forms of angina pectoris: Secondary | ICD-10-CM

## 2019-09-25 DIAGNOSIS — E559 Vitamin D deficiency, unspecified: Secondary | ICD-10-CM | POA: Diagnosis not present

## 2019-09-25 DIAGNOSIS — E1140 Type 2 diabetes mellitus with diabetic neuropathy, unspecified: Secondary | ICD-10-CM | POA: Diagnosis not present

## 2019-09-25 DIAGNOSIS — E1149 Type 2 diabetes mellitus with other diabetic neurological complication: Secondary | ICD-10-CM | POA: Diagnosis not present

## 2019-09-25 DIAGNOSIS — E1165 Type 2 diabetes mellitus with hyperglycemia: Secondary | ICD-10-CM | POA: Diagnosis not present

## 2019-09-25 DIAGNOSIS — Z794 Long term (current) use of insulin: Secondary | ICD-10-CM

## 2019-09-25 DIAGNOSIS — E782 Mixed hyperlipidemia: Secondary | ICD-10-CM

## 2019-09-25 LAB — BAYER DCA HB A1C WAIVED: HB A1C (BAYER DCA - WAIVED): 8.9 % — ABNORMAL HIGH (ref <7.0)

## 2019-09-25 NOTE — Progress Notes (Signed)
Subjective:  Patient ID: Thomas Maldonado,  male    DOB: September 06, 1933  Age: 86 y.o.    CC: Follow-up (3 month)   HPI Thomas Maldonado presents for  follow-up of hypertension. Patient has no history of headache chest pain or shortness of breath or recent cough. Patient also denies symptoms of TIA such as numbness weakness lateralizing. Patient denies side effects from medication. States taking it regularly.  Patient also  in for follow-up of elevated cholesterol. Doing well without complaints on current medication. Denies side effects  including myalgia and arthralgia and nausea. Also in today for liver function testing. Currently no chest pain, shortness of breath or other cardiovascular related symptoms noted.  Follow-up of diabetes. Patient does check blood sugar at home. Readings run between 100-120 fasting and 200-300 PP Patient denies symptoms such as excessive hunger or urinary frequency, excessive hunger, nausea No significant hypoglycemic spells noted. Medications reviewed. Pt reports taking them regularly. Pt. denies complication/adverse reaction today.   Patient reports frequent headache.  He is having trouble with balance.  These are going on all day every day now.  The headache is all over his head moderate severity that relief with Tylenol.  He is also having some heartburn.  It is unclear whether he is taking the omeprazole.  Increase your topiramate to 50 mg daily to help prevent headache.  History Thomas Maldonado has a past medical history of Acute upper respiratory infections of unspecified site, AV block, CAD (coronary artery disease), Chronic kidney disease, stage III (moderate), Coronary atherosclerosis of unspecified type of vessel, native or graft (1999), Diabetes mellitus without complication ([place]), Esophageal reflux, Hypertension, Nausea, NSVT (nonsustained ventricular tachycardia) ([place]), Other chronic pain, Problems with hearing, Problems with sight, Pure hypercholesterolemia, and Type  II or unspecified type diabetes mellitus with renal manifestations, not stated as uncontrolled(250.40).   He has a past surgical history that includes Neck surgery (2008); Shoulder surgery (2000); Knee arthroscopy (Right, 2000); Transurethral prostatectomy with gyrus instruments (1999); Foot surgery (Left, 1982); Hernia repair; Colon surgery; Pacemaker insertion (2014); and LEFT HEART CATH AND CORONARY ANGIOGRAPHY (N/A, 05/26/2018).   His family history includes Cancer in his brother, brother, brother, brother, brother, brother, sister, sister, sister, and sister; Diabetes in his brother, brother, brother, father, sister, and sister; Heart disease in his brother, brother, brother, brother, brother, brother, sister, sister, sister, and sister.He reports that he quit smoking about 37 years ago. His smoking use included cigarettes. He has never used smokeless tobacco. He reports that he does not drink alcohol or use drugs.  Current Outpatient Medications on File Prior to Visit  Medication Sig Dispense Refill  . acetaminophen (TYLENOL) 500 MG tablet Take 500-1,000 mg by mouth every 6 (six) hours as needed (FOR HEADACHES).    Marland Kitchen aspirin 81 MG tablet Take 81 mg by mouth every evening.     Marland Kitchen azelastine (ASTELIN) 0.1 % nasal spray Place 2 sprays into both nostrils 2 (two) times daily. Use in each nostril as directed 30 mL 12  . blood glucose meter kit and supplies KIT Dispense based on patient and insurance preference. Use up to four times daily as directed. (FOR ICD-9 250.00, 250.01). 1 each 0  . celecoxib (CELEBREX) 200 MG capsule Take 1 capsule by mouth once daily 90 capsule 0  . diclofenac (VOLTAREN) 0.1 % ophthalmic solution Place 1 drop into both eyes 4 (four) times daily. 5 mL 2  . ezetimibe (ZETIA) 10 MG tablet Take 1 tablet (10 mg total) by mouth daily.  90 tablet 1  . glipiZIDE (GLUCOTROL) 10 MG tablet TAKE 1 TABLET BY MOUTH TWICE DAILY BEFORE A MEAL 180 tablet 1  . glucose blood test strip Test BS QID  and PRN 100 each 12  . insulin regular (HUMULIN R) 100 units/mL injection Inject 0.1 mLs (10 Units total) into the skin 2 (two) times daily before a meal. 30 mL 1  . Lancets (ACCU-CHEK SOFT TOUCH) lancets Use as instructed 100 each 12  . LEVEMIR FLEXTOUCH 100 UNIT/ML FlexPen INJECT 60 UNITS SUBCUTANEOUSLY ONCE DAILY 30 mL 0  . lisinopril (ZESTRIL) 10 MG tablet Take 1 tablet (10 mg total) by mouth daily.    . meclizine (ANTIVERT) 25 MG tablet Take 1 tablet (25 mg total) by mouth every 6 (six) hours as needed for dizziness or nausea. 30 tablet 0  . metoprolol tartrate (LOPRESSOR) 25 MG tablet Take 1/2 (one-half) tablet by mouth twice daily 180 tablet 1  . omeprazole (PRILOSEC) 20 MG capsule Take 2 capsules (40 mg total) by mouth daily. 180 capsule 1  . ondansetron (ZOFRAN-ODT) 4 MG disintegrating tablet Take 1 tablet (4 mg total) by mouth every 8 (eight) hours as needed for nausea or vomiting. 90 tablet 2  . pravastatin (PRAVACHOL) 20 MG tablet Take 1 tablet (20 mg total) by mouth daily. 90 tablet 3  . topiramate (TOPAMAX) 25 MG tablet Take 1 tablet (25 mg total) by mouth daily. 90 tablet 1  . Vitamin D, Ergocalciferol, (DRISDOL) 1.25 MG (50000 UNIT) CAPS capsule Take 1 capsule (50,000 Units total) by mouth every Thursday. 5 capsule    No current facility-administered medications on file prior to visit.    ROS Review of Systems  Constitutional: Negative.   HENT: Negative.   Eyes: Negative for visual disturbance.  Respiratory: Negative for cough and shortness of breath.   Cardiovascular: Negative for chest pain and leg swelling.  Gastrointestinal: Negative for abdominal pain, diarrhea, nausea and vomiting.  Genitourinary: Negative for difficulty urinating.  Musculoskeletal: Negative for arthralgias and myalgias.  Skin: Negative for rash.  Neurological: Positive for dizziness and headaches.  Psychiatric/Behavioral: Negative for sleep disturbance.    Objective:  BP 130/79   Pulse 60    Temp (!) 97.1 F (36.2 C) (Temporal)   Ht 6' (1.829 m)   Wt 231 lb 3.2 oz (104.9 kg)   BMI 31.36 kg/m   BP Readings from Last 3 Encounters:  09/25/19 130/79  06/26/19 136/80  08/02/18 137/79    Wt Readings from Last 3 Encounters:  09/25/19 231 lb 3.2 oz (104.9 kg)  06/26/19 230 lb 6.4 oz (104.5 kg)  08/02/18 224 lb 6 oz (101.8 kg)     Physical Exam  Diabetic Foot Exam - Simple   No data filed        Assessment & Plan:   Thomas Maldonado was seen today for follow-up.  Diagnoses and all orders for this visit:  Diabetic neuropathy with neurologic complication ([place]) -     CBC with Differential/Platelet -     CMP14+EGFR -     Bayer DCA Hb A1c Waived  Essential hypertension -     CBC with Differential/Platelet -     CMP14+EGFR  Coronary artery disease of native heart with stable angina pectoris, unspecified vessel or lesion type (HCC) -     CBC with Differential/Platelet -     CMP14+EGFR  Mixed hyperlipidemia -     CBC with Differential/Platelet -     CMP14+EGFR  Vitamin D deficiency -  CBC with Differential/Platelet -     CMP14+EGFR -     VITAMIN D 25 Hydroxy (Vit-D Deficiency, Fractures)  Type 2 diabetes mellitus with hyperglycemia, with long-term current use of insulin (HCC) -     CBC with Differential/Platelet -     CMP14+EGFR -     Bayer DCA Hb A1c Waived   I have discontinued Mikaele Kolton's oxyCODONE-acetaminophen. I am also having him maintain his aspirin, accu-chek soft touch, acetaminophen, meclizine, ondansetron, azelastine, diclofenac, blood glucose meter kit and supplies, glucose blood, ezetimibe, glipiZIDE, insulin regular, lisinopril, metoprolol tartrate, omeprazole, pravastatin, topiramate, Vitamin D (Ergocalciferol), Levemir FlexTouch, and celecoxib. I will increase the topiramate to 50 mg, take it once daily.  I will send in a prescription for the stronger pill.  Need to see Lottie Dawson for diabetes education care she is our new clinical  pharmacist.  Her office is inside our building.  You should take your Humulin R insulin 10 units before lunch and supper.  You should leave the Levemir at 60 units daily.  I will discuss with Ms. Pruitt, we may make some changes in those when you see her.  Regarding your heartburn, be sure to take your Prilosec 40 mg tablet twice daily.  Be sure that your stomach is empty when you take it and that you not eat or drink anything other than water for another hour after taking it.  If this does not help your heartburn please let me know.    Follow-up: Return in about 3 months (around 12/26/2019) for diabetes.  Claretta Fraise, M.D.

## 2019-09-25 NOTE — Patient Instructions (Signed)
I will increase the topiramate to 50 mg, take it once daily.  I will send in a prescription for the stronger pill.  Need to see Thomas Maldonado for diabetes education care she is our new clinical pharmacist.  Her office is inside our building.  You should take your Humulin R insulin 10 units before lunch and supper.  You should leave the Levemir at 60 units daily.  I will discuss with Thomas Maldonado, we may make some changes in those when you see her.  Regarding your heartburn, be sure to take your Prilosec 40 mg tablet twice daily.  Be sure that your stomach is empty when you take it and that you not eat or drink anything other than water for another hour after taking it.  If this does not help your heartburn please let me know.

## 2019-09-26 ENCOUNTER — Encounter: Payer: Self-pay | Admitting: Family Medicine

## 2019-09-26 LAB — CMP14+EGFR
ALT: 20 IU/L (ref 0–44)
AST: 21 IU/L (ref 0–40)
Albumin/Globulin Ratio: 1.6 (ref 1.2–2.2)
Albumin: 4.1 g/dL (ref 3.6–4.6)
Alkaline Phosphatase: 115 IU/L (ref 39–117)
BUN/Creatinine Ratio: 14 (ref 10–24)
BUN: 17 mg/dL (ref 8–27)
Bilirubin Total: 0.3 mg/dL (ref 0.0–1.2)
CO2: 23 mmol/L (ref 20–29)
Calcium: 9 mg/dL (ref 8.6–10.2)
Chloride: 105 mmol/L (ref 96–106)
Creatinine, Ser: 1.25 mg/dL (ref 0.76–1.27)
GFR calc Af Amer: 61 mL/min/{1.73_m2} (ref >59)
GFR calc non Af Amer: 53 mL/min/{1.73_m2} — ABNORMAL LOW (ref >59)
Globulin, Total: 2.5 g/dL (ref 1.5–4.5)
Glucose: 173 mg/dL — ABNORMAL HIGH (ref 65–99)
Potassium: 5.3 mmol/L — ABNORMAL HIGH (ref 3.5–5.2)
Sodium: 142 mmol/L (ref 134–144)
Total Protein: 6.6 g/dL (ref 6.0–8.5)

## 2019-09-26 LAB — CBC WITH DIFFERENTIAL/PLATELET
Basophils Absolute: 0.1 10*3/uL (ref 0.0–0.2)
Basos: 1 %
EOS (ABSOLUTE): 2.1 10*3/uL — ABNORMAL HIGH (ref 0.0–0.4)
Eos: 23 %
Hematocrit: 44 % (ref 37.5–51.0)
Hemoglobin: 14.8 g/dL (ref 13.0–17.7)
Immature Grans (Abs): 0 10*3/uL (ref 0.0–0.1)
Immature Granulocytes: 0 %
Lymphocytes Absolute: 2.2 10*3/uL (ref 0.7–3.1)
Lymphs: 25 %
MCH: 31 pg (ref 26.6–33.0)
MCHC: 33.6 g/dL (ref 31.5–35.7)
MCV: 92 fL (ref 79–97)
Monocytes Absolute: 0.7 10*3/uL (ref 0.1–0.9)
Monocytes: 8 %
Neutrophils Absolute: 3.9 10*3/uL (ref 1.4–7.0)
Neutrophils: 43 %
Platelets: 146 10*3/uL — ABNORMAL LOW (ref 150–450)
RBC: 4.78 x10E6/uL (ref 4.14–5.80)
RDW: 12.6 % (ref 11.6–15.4)
WBC: 9.1 10*3/uL (ref 3.4–10.8)

## 2019-09-26 LAB — VITAMIN D 25 HYDROXY (VIT D DEFICIENCY, FRACTURES): Vit D, 25-Hydroxy: 17.5 ng/mL — ABNORMAL LOW (ref 30.0–100.0)

## 2019-10-02 ENCOUNTER — Ambulatory Visit (INDEPENDENT_AMBULATORY_CARE_PROVIDER_SITE_OTHER): Payer: Medicare Other | Admitting: Pharmacist

## 2019-10-02 ENCOUNTER — Other Ambulatory Visit: Payer: Self-pay

## 2019-10-02 DIAGNOSIS — E1165 Type 2 diabetes mellitus with hyperglycemia: Secondary | ICD-10-CM

## 2019-10-02 DIAGNOSIS — Z794 Long term (current) use of insulin: Secondary | ICD-10-CM | POA: Diagnosis not present

## 2019-10-02 NOTE — Progress Notes (Signed)
        [place] --DIABETES 10/03/2019 Name: Thomas Maldonado MRN: [id number] DOB: [birth year]  Referred by: Claretta Fraise, MD Reason for referral : Diabetes  S:    12 yoM presents for diabetes evaluation, education, and management.  Patient was referred and last seen by Primary Care Provider on 09/25/19.   Insurance coverage/medication affordability: [employer] medicare   Patient reports adherence with medications.  Current diabetes medications include: Levemir 60u AM, Novolin R 10u BID w/meals, glipizide  Current hypertension medications include: lisinopril  Current hyperlipidemia medications include: pravastatin  Patient denies hypoglycemic events. Patient-reported exercise habits: n/a  O:   Lab Results  Component Value Date   HGBA1C 8.9 (H) 09/25/2019   Lipid Panel     Component Value Date/Time   CHOL 109 05/07/2019 0937   TRIG 152 (H) 05/07/2019 0937   HDL 37 (L) 05/07/2019 0937   CHOLHDL 2.9 05/07/2019 0937   LDLCALC 46 05/07/2019 0937    Home fasting blood sugars: 180-200s  2 hour post-meal/random blood sugars: 200-300s.    A/P:  Diabetes currently UNCONTROLLED. Patient is able to verbalize appropriate hypoglycemia management plan. Patient is adherent with medication. Control is suboptimal due to DIET/suboptimal diabetes regimen.  -Discontinued Levemir, GLIPIZIDE and Novolin R twice daily  -Started basal insulin TRESIBA PEN (insulin DEGLUDEC) 50 UNITS IN THE AM.   -Started GLP-1 OZEMPIC (generic name: SEMGALUTIDE) 0.25MG  SQ WEEKLY X4 WEEKS (STARTED TODAY IN OFFICE).  WILL INCREASE DOSE TO 0.5MG  SQ WEEKLY THERAFTER.  DENIES H/O [facility] CANCER  -Extensively discussed pathophysiology of diabetes, recommended lifestyle interventions, dietary effects on blood sugar control  -Counseled on s/sx of and management of hypoglycemia  -Next A1C anticipated July 2021   -Continued aspirin 81 mg  -Continued pravastatin 20 mg.    Written patient  instructions provided.  Total time in face to face counseling 31 minutes.   Follow up Pharmacist Clinic Visit in [place].       Regina Eck, PharmD, BCPS Clinical Pharmacist, [place]  II Phone [phone]

## 2019-10-03 ENCOUNTER — Encounter: Payer: Self-pay | Admitting: Pharmacist

## 2019-10-03 DIAGNOSIS — M7912 Myalgia of auxiliary muscles, head and neck: Secondary | ICD-10-CM | POA: Diagnosis not present

## 2019-10-03 DIAGNOSIS — Z79899 Other long term (current) drug therapy: Secondary | ICD-10-CM | POA: Diagnosis not present

## 2019-10-03 DIAGNOSIS — G43709 Chronic migraine without aura, not intractable, without status migrainosus: Secondary | ICD-10-CM | POA: Diagnosis not present

## 2019-10-03 DIAGNOSIS — M479 Spondylosis, unspecified: Secondary | ICD-10-CM | POA: Diagnosis not present

## 2019-10-03 DIAGNOSIS — M5030 Other cervical disc degeneration, unspecified cervical region: Secondary | ICD-10-CM | POA: Diagnosis not present

## 2019-10-08 ENCOUNTER — Telehealth: Payer: Self-pay | Admitting: Family Medicine

## 2019-10-08 MED ORDER — TRESIBA FLEXTOUCH 200 UNIT/ML ~~LOC~~ SOPN: 50.0000 [IU] | PEN_INJECTOR | Freq: Every morning | SUBCUTANEOUS | 3 refills | Status: DC

## 2019-10-08 NOTE — Telephone Encounter (Signed)
  Prescription Request  10/08/2019  What is the name of the medication or equipment? tresiba flex touch/ pt wants samples or called into [facility]  Have you contacted your pharmacy to request a refill? (if applicable) no  Which pharmacy would you like this sent to? [facility]   Patient notified that their request is being sent to the clinical staff for review and that they should receive a response within 2 business days.

## 2019-10-08 NOTE — Telephone Encounter (Signed)
RX for Tresiba 200 unit/ml pens called in to [place] Titrate up to 60 units per day 600 units/per pen, 3 pens per box= 30 day supply

## 2019-10-18 ENCOUNTER — Telehealth: Payer: Self-pay | Admitting: *Deleted

## 2019-10-18 NOTE — Telephone Encounter (Signed)
[facility] called to tell us that they saw pt this am and he was very confused. Pt was repeating himself and also giving 2 different answers to most questions. Unfortunely pt had already left their office when they called. Attempted to call pt on his phone all day from 10;30 , with 4:55 being the last time. No answer.

## 2019-10-25 ENCOUNTER — Ambulatory Visit (INDEPENDENT_AMBULATORY_CARE_PROVIDER_SITE_OTHER): Payer: Medicare Other | Admitting: Pharmacist

## 2019-10-25 DIAGNOSIS — E1165 Type 2 diabetes mellitus with hyperglycemia: Secondary | ICD-10-CM | POA: Diagnosis not present

## 2019-10-25 DIAGNOSIS — Z794 Long term (current) use of insulin: Secondary | ICD-10-CM | POA: Diagnosis not present

## 2019-10-25 NOTE — Progress Notes (Signed)
    10/25/2019 Name: Thomas Maldonado MRN: [id number] DOB: 24-Feb-1934  S:  55 yoM presents for diabetes evaluation, education, and management.  Patient was referred and last seen by Primary Care Provider on 09/25/19.   I connected with  Fawn Kirk on 11/05/19 by a video enabled telemedicine application and verified that I am speaking with the correct person using two identifiers.  I discussed the limitations of evaluation and management by telemedicine. The patient expressed understanding and agreed to proceed.  Insurance coverage/medication affordability: UHC medicare   Patient reports adherence with medications.  Current diabetes medications include: Ozempic, Tresbia  Current hypertension medications include: lisinopril  Current hyperlipidemia medications include: pravastatin  Patient denies hypoglycemic events. Patient-reported exercise habits: n/a  O:  Lab Results  Component Value Date   HGBA1C 8.9 (H) 09/25/2019   Lipid Panel     Component Value Date/Time   CHOL 109 05/07/2019 0937   TRIG 152 (H) 05/07/2019 0937   HDL 37 (L) 05/07/2019 0937   CHOLHDL 2.9 05/07/2019 0937   LDLCALC 46 05/07/2019 0937    Home fasting blood sugars: 120-150 (decreasing)  2 hour post-meal/random blood sugars: n/a.   A/P:  Diabetes T2DM currently UNCONTROLLED. Patient is able to verbalize appropriate hypoglycemia management plan. Patient is adherent with medication. Control is suboptimal due to INTOLERANCE TO OZEMPIC.  -Continued basal insulin TRESIBA (insulin DEGLUDEC). 50 units in the morning at 8:30am  -Added  rapid insulin NOVOLOG SLIDING SCALE AS DIRECTED ON PRINTED HAND OUT (insulin aspart)   -Discontinued GLP-1 OZEMPIC (generic name SEMAGLUTIDE) DUE TO INTOLERANCE (pt does not wish to take anymore due to nausea/not eating)  -Extensively discussed pathophysiology of diabetes, recommended lifestyle interventions, dietary effects on blood sugar control  -Counseled on s/sx of  and management of hypoglycemia  -Next A1C anticipated 3-6 months  Total time in counseling 15 minutes.   Follow up PCP Clinic Visit ON 01/01/20. F/u w/ pharmD in 1 month  Regina Eck, PharmD, BCPS Clinical Pharmacist, [place]  II Phone [phone]

## 2019-11-02 DIAGNOSIS — E119 Type 2 diabetes mellitus without complications: Secondary | ICD-10-CM | POA: Diagnosis not present

## 2019-11-02 DIAGNOSIS — G43709 Chronic migraine without aura, not intractable, without status migrainosus: Secondary | ICD-10-CM | POA: Diagnosis not present

## 2019-11-02 DIAGNOSIS — Z79899 Other long term (current) drug therapy: Secondary | ICD-10-CM | POA: Diagnosis not present

## 2019-11-02 DIAGNOSIS — M479 Spondylosis, unspecified: Secondary | ICD-10-CM | POA: Diagnosis not present

## 2019-11-02 DIAGNOSIS — M7912 Myalgia of auxiliary muscles, head and neck: Secondary | ICD-10-CM | POA: Diagnosis not present

## 2019-11-02 DIAGNOSIS — M5030 Other cervical disc degeneration, unspecified cervical region: Secondary | ICD-10-CM | POA: Diagnosis not present

## 2019-11-05 ENCOUNTER — Other Ambulatory Visit: Payer: Self-pay | Admitting: Family Medicine

## 2019-11-05 NOTE — Telephone Encounter (Signed)
NA / NVM RF was sent to [facility] on 09/24/19 #90

## 2019-11-08 ENCOUNTER — Ambulatory Visit: Payer: Self-pay | Admitting: Pharmacist

## 2019-11-08 ENCOUNTER — Telehealth: Payer: Self-pay | Admitting: Family Medicine

## 2019-11-08 MED ORDER — TRESIBA FLEXTOUCH 200 UNIT/ML ~~LOC~~ SOPN: 50.0000 [IU] | PEN_INJECTOR | Freq: Every morning | SUBCUTANEOUS | 3 refills | Status: DC

## 2019-11-08 NOTE — Telephone Encounter (Signed)
°  Prescription Request  11/08/2019  What is the name of the medication or equipment? Insulin  Have you contacted your pharmacy to request a refill? (if applicable) yes  Which pharmacy would you like this sent to? [facility] [place]    Patient notified that their request is being sent to the clinical staff for review and that they should receive a response within 2 business days.

## 2019-11-08 NOTE — Telephone Encounter (Signed)
Patients wife aware med sent in.

## 2019-11-18 DIAGNOSIS — E785 Hyperlipidemia, unspecified: Secondary | ICD-10-CM | POA: Diagnosis not present

## 2019-11-18 DIAGNOSIS — Z743 Need for continuous supervision: Secondary | ICD-10-CM | POA: Diagnosis not present

## 2019-11-18 DIAGNOSIS — E079 Disorder of thyroid, unspecified: Secondary | ICD-10-CM | POA: Diagnosis not present

## 2019-11-18 DIAGNOSIS — Z87891 Personal history of nicotine dependence: Secondary | ICD-10-CM | POA: Diagnosis not present

## 2019-11-18 DIAGNOSIS — Z950 Presence of cardiac pacemaker: Secondary | ICD-10-CM | POA: Diagnosis not present

## 2019-11-18 DIAGNOSIS — I129 Hypertensive chronic kidney disease with stage 1 through stage 4 chronic kidney disease, or unspecified chronic kidney disease: Secondary | ICD-10-CM | POA: Diagnosis not present

## 2019-11-18 DIAGNOSIS — R9431 Abnormal electrocardiogram [ECG] [EKG]: Secondary | ICD-10-CM | POA: Diagnosis not present

## 2019-11-18 DIAGNOSIS — N189 Chronic kidney disease, unspecified: Secondary | ICD-10-CM | POA: Diagnosis not present

## 2019-11-18 DIAGNOSIS — R6889 Other general symptoms and signs: Secondary | ICD-10-CM | POA: Diagnosis not present

## 2019-11-18 DIAGNOSIS — E1122 Type 2 diabetes mellitus with diabetic chronic kidney disease: Secondary | ICD-10-CM | POA: Diagnosis not present

## 2019-11-18 DIAGNOSIS — R0902 Hypoxemia: Secondary | ICD-10-CM | POA: Diagnosis not present

## 2019-11-18 DIAGNOSIS — Z79899 Other long term (current) drug therapy: Secondary | ICD-10-CM | POA: Diagnosis not present

## 2019-11-18 DIAGNOSIS — Z794 Long term (current) use of insulin: Secondary | ICD-10-CM | POA: Diagnosis not present

## 2019-11-18 DIAGNOSIS — E1165 Type 2 diabetes mellitus with hyperglycemia: Secondary | ICD-10-CM | POA: Diagnosis not present

## 2019-11-22 ENCOUNTER — Ambulatory Visit (INDEPENDENT_AMBULATORY_CARE_PROVIDER_SITE_OTHER): Payer: Medicare Other | Admitting: Pharmacist

## 2019-11-22 DIAGNOSIS — E1165 Type 2 diabetes mellitus with hyperglycemia: Secondary | ICD-10-CM

## 2019-11-22 DIAGNOSIS — Z794 Long term (current) use of insulin: Secondary | ICD-10-CM | POA: Diagnosis not present

## 2019-11-22 MED ORDER — GLUCOSE BLOOD VI STRP: ORAL_STRIP | 12 refills | Status: DC

## 2019-11-22 NOTE — Progress Notes (Signed)
    11/22/2019 Name: Thomas Maldonado MRN: [id number] DOB: 07-05-1933   S:  57 yoM presents for diabetes evaluation, education, and management.  Patient with recent ED admission for hyperglycemia.  He reports not taking his sliding scale as directed   I connected with Fawn Kirk on 11/22/19 by a video enabled telemedicine application and verified that I am speaking with the correct person using two identifiers.  I discussed the limitations of evaluation and management by telemedicine. The patient expressed understanding and agreed to proceed.  Insurance coverage/medication affordability: Facilities manager with medications.  Current diabetes medications include:Tresiba,   Current hypertension medications include:lisinopril  Current hyperlipidemia medications include:pravastatin  Patientdenieshypoglycemic events. Patient-reported exercise habits:n/a O:  Lab Results  Component Value Date   HGBA1C 8.9 (H) 09/25/2019   Lipid Panel     Component Value Date/Time   CHOL 109 05/07/2019 0937   TRIG 152 (H) 05/07/2019 0937   HDL 37 (L) 05/07/2019 0937   CHOLHDL 2.9 05/07/2019 0937   LDLCALC 46 05/07/2019 0937    Home fasting blood sugars: <200  2 hour post-meal/random blood sugars: varies.      A/P:  Diabetes T2DM currently uncontrolled. Patient is able to verbalize appropriate hypoglycemia management plan. Patient is somewhat adherent with medication. Control is suboptimal due to diet/not taking .  -Continued basal insulin TRESIBA (insulin DEGLUDEC). 50 units in the morning at 8:30am  -Continue rapid insulin NOVOLOG SLIDING SCALE AS DIRECTED ON PRINTED HAND OUT (insulin aspart) --patient not compliant with sliding scale   -Accucheck test strips called in to [place] (patient testing up to 6 times daily)  -Consider adding SGLT2 at next appt  -Extensively discussed pathophysiology of diabetes, recommended lifestyle interventions,  dietary effects on blood sugar control  -Counseled on s/sx of and management of hypoglycemia  -Next A1C anticipated 3-6 months  Total time in counseling 15 minutes.   Follow up PCP Clinic Visit ON 01/01/20.   Regina Eck, PharmD, BCPS Clinical Pharmacist, [place]  II Phone [phone]

## 2019-12-02 DIAGNOSIS — Z79899 Other long term (current) drug therapy: Secondary | ICD-10-CM | POA: Diagnosis not present

## 2019-12-02 DIAGNOSIS — M5030 Other cervical disc degeneration, unspecified cervical region: Secondary | ICD-10-CM | POA: Diagnosis not present

## 2019-12-02 DIAGNOSIS — G43709 Chronic migraine without aura, not intractable, without status migrainosus: Secondary | ICD-10-CM | POA: Diagnosis not present

## 2019-12-02 DIAGNOSIS — M7912 Myalgia of auxiliary muscles, head and neck: Secondary | ICD-10-CM | POA: Diagnosis not present

## 2019-12-02 DIAGNOSIS — M479 Spondylosis, unspecified: Secondary | ICD-10-CM | POA: Diagnosis not present

## 2019-12-11 ENCOUNTER — Other Ambulatory Visit: Payer: Self-pay | Admitting: Family Medicine

## 2019-12-11 DIAGNOSIS — J9601 Acute respiratory failure with hypoxia: Secondary | ICD-10-CM | POA: Diagnosis not present

## 2019-12-11 DIAGNOSIS — Z7982 Long term (current) use of aspirin: Secondary | ICD-10-CM | POA: Diagnosis not present

## 2019-12-11 DIAGNOSIS — R590 Localized enlarged lymph nodes: Secondary | ICD-10-CM | POA: Diagnosis not present

## 2019-12-11 DIAGNOSIS — R339 Retention of urine, unspecified: Secondary | ICD-10-CM | POA: Diagnosis not present

## 2019-12-11 DIAGNOSIS — J90 Pleural effusion, not elsewhere classified: Secondary | ICD-10-CM | POA: Diagnosis not present

## 2019-12-11 DIAGNOSIS — I25119 Atherosclerotic heart disease of native coronary artery with unspecified angina pectoris: Secondary | ICD-10-CM | POA: Diagnosis not present

## 2019-12-11 DIAGNOSIS — R911 Solitary pulmonary nodule: Secondary | ICD-10-CM | POA: Diagnosis not present

## 2019-12-11 DIAGNOSIS — Z20822 Contact with and (suspected) exposure to covid-19: Secondary | ICD-10-CM | POA: Diagnosis not present

## 2019-12-11 DIAGNOSIS — K5730 Diverticulosis of large intestine without perforation or abscess without bleeding: Secondary | ICD-10-CM | POA: Diagnosis not present

## 2019-12-11 DIAGNOSIS — J8410 Pulmonary fibrosis, unspecified: Secondary | ICD-10-CM | POA: Diagnosis not present

## 2019-12-11 DIAGNOSIS — R0600 Dyspnea, unspecified: Secondary | ICD-10-CM | POA: Diagnosis not present

## 2019-12-11 DIAGNOSIS — Z950 Presence of cardiac pacemaker: Secondary | ICD-10-CM | POA: Diagnosis not present

## 2019-12-11 DIAGNOSIS — Z723 Lack of physical exercise: Secondary | ICD-10-CM | POA: Diagnosis not present

## 2019-12-11 DIAGNOSIS — N183 Chronic kidney disease, stage 3 unspecified: Secondary | ICD-10-CM | POA: Diagnosis not present

## 2019-12-11 DIAGNOSIS — E11649 Type 2 diabetes mellitus with hypoglycemia without coma: Secondary | ICD-10-CM | POA: Diagnosis not present

## 2019-12-11 DIAGNOSIS — Z888 Allergy status to other drugs, medicaments and biological substances status: Secondary | ICD-10-CM | POA: Diagnosis not present

## 2019-12-11 DIAGNOSIS — Z794 Long term (current) use of insulin: Secondary | ICD-10-CM | POA: Diagnosis not present

## 2019-12-11 DIAGNOSIS — Z955 Presence of coronary angioplasty implant and graft: Secondary | ICD-10-CM | POA: Diagnosis not present

## 2019-12-11 DIAGNOSIS — K7460 Unspecified cirrhosis of liver: Secondary | ICD-10-CM | POA: Diagnosis not present

## 2019-12-11 DIAGNOSIS — K8689 Other specified diseases of pancreas: Secondary | ICD-10-CM | POA: Diagnosis not present

## 2019-12-11 DIAGNOSIS — R918 Other nonspecific abnormal finding of lung field: Secondary | ICD-10-CM | POA: Diagnosis not present

## 2019-12-11 DIAGNOSIS — G4733 Obstructive sleep apnea (adult) (pediatric): Secondary | ICD-10-CM | POA: Diagnosis not present

## 2019-12-11 DIAGNOSIS — I129 Hypertensive chronic kidney disease with stage 1 through stage 4 chronic kidney disease, or unspecified chronic kidney disease: Secondary | ICD-10-CM | POA: Diagnosis not present

## 2019-12-11 DIAGNOSIS — J189 Pneumonia, unspecified organism: Secondary | ICD-10-CM | POA: Diagnosis not present

## 2019-12-11 DIAGNOSIS — I2510 Atherosclerotic heart disease of native coronary artery without angina pectoris: Secondary | ICD-10-CM | POA: Diagnosis not present

## 2019-12-11 DIAGNOSIS — Z96651 Presence of right artificial knee joint: Secondary | ICD-10-CM | POA: Diagnosis not present

## 2019-12-11 DIAGNOSIS — E1165 Type 2 diabetes mellitus with hyperglycemia: Secondary | ICD-10-CM | POA: Diagnosis not present

## 2019-12-11 DIAGNOSIS — I252 Old myocardial infarction: Secondary | ICD-10-CM | POA: Diagnosis not present

## 2019-12-11 DIAGNOSIS — Z87891 Personal history of nicotine dependence: Secondary | ICD-10-CM | POA: Diagnosis not present

## 2019-12-11 DIAGNOSIS — I441 Atrioventricular block, second degree: Secondary | ICD-10-CM | POA: Diagnosis not present

## 2019-12-11 DIAGNOSIS — J168 Pneumonia due to other specified infectious organisms: Secondary | ICD-10-CM | POA: Diagnosis not present

## 2019-12-11 DIAGNOSIS — R0602 Shortness of breath: Secondary | ICD-10-CM | POA: Diagnosis not present

## 2019-12-11 DIAGNOSIS — R5383 Other fatigue: Secondary | ICD-10-CM | POA: Diagnosis not present

## 2019-12-11 DIAGNOSIS — R0689 Other abnormalities of breathing: Secondary | ICD-10-CM | POA: Diagnosis not present

## 2019-12-11 DIAGNOSIS — J9602 Acute respiratory failure with hypercapnia: Secondary | ICD-10-CM | POA: Diagnosis not present

## 2019-12-11 DIAGNOSIS — E785 Hyperlipidemia, unspecified: Secondary | ICD-10-CM | POA: Diagnosis not present

## 2019-12-11 DIAGNOSIS — Z79899 Other long term (current) drug therapy: Secondary | ICD-10-CM | POA: Diagnosis not present

## 2019-12-11 DIAGNOSIS — I10 Essential (primary) hypertension: Secondary | ICD-10-CM | POA: Diagnosis not present

## 2019-12-11 DIAGNOSIS — K219 Gastro-esophageal reflux disease without esophagitis: Secondary | ICD-10-CM | POA: Diagnosis not present

## 2019-12-11 DIAGNOSIS — E1122 Type 2 diabetes mellitus with diabetic chronic kidney disease: Secondary | ICD-10-CM | POA: Diagnosis not present

## 2019-12-11 DIAGNOSIS — Z743 Need for continuous supervision: Secondary | ICD-10-CM | POA: Diagnosis not present

## 2019-12-11 DIAGNOSIS — Z8711 Personal history of peptic ulcer disease: Secondary | ICD-10-CM | POA: Diagnosis not present

## 2019-12-11 DIAGNOSIS — N1831 Chronic kidney disease, stage 3a: Secondary | ICD-10-CM | POA: Diagnosis not present

## 2019-12-11 NOTE — Telephone Encounter (Signed)
  Prescription Request  12/11/2019  What is the name of the medication or equipment? Novofine plus 32G - Disposable needles  Have you contacted your pharmacy to request a refill? (if applicable) yes  Which pharmacy would you like this sent to? [facility] in [place]   Patient notified that their request is being sent to the clinical staff for review and that they should receive a response within 2 business days.

## 2019-12-12 ENCOUNTER — Encounter: Payer: Self-pay | Admitting: Family Medicine

## 2019-12-12 ENCOUNTER — Ambulatory Visit (INDEPENDENT_AMBULATORY_CARE_PROVIDER_SITE_OTHER): Payer: Medicare Other | Admitting: Family Medicine

## 2019-12-12 MED ORDER — NOVOFINE PLUS 32G X 4 MM MISC: 3 refills | Status: DC

## 2019-12-12 MED ORDER — NOVOFINE PLUS 32G X 4 MM MISC: 3 refills | Status: AC

## 2019-12-12 NOTE — Telephone Encounter (Signed)
Refill failed. resent °

## 2019-12-12 NOTE — Progress Notes (Signed)
Pt. Admitted to the hospital yesterday for pneumonia.  Erroneous visit.  Claretta Fraise, MD

## 2019-12-12 NOTE — Addendum Note (Signed)
Addended by: Antonietta Barcelona D on: 12/12/2019 10:45 AM   Modules accepted: Orders

## 2019-12-12 NOTE — Telephone Encounter (Signed)
Does that mean that this is taken care of? What can I do to help?

## 2019-12-12 NOTE — Telephone Encounter (Signed)
Pen needles sent to pharmacy  Wanted to let Dr. Livia Snellen know pt is current at [facility]

## 2019-12-20 ENCOUNTER — Encounter (HOSPITAL_COMMUNITY): Payer: Self-pay

## 2019-12-20 ENCOUNTER — Emergency Department (HOSPITAL_COMMUNITY)
Admission: EM | Admit: 2019-12-20 | Discharge: 2019-12-20 | Disposition: A | Discharge status: Home / Self Care | Payer: Medicare Other | Attending: Emergency Medicine | Admitting: Emergency Medicine

## 2019-12-20 ENCOUNTER — Telehealth: Payer: Self-pay

## 2019-12-20 DIAGNOSIS — Z79899 Other long term (current) drug therapy: Secondary | ICD-10-CM | POA: Diagnosis not present

## 2019-12-20 DIAGNOSIS — Z723 Lack of physical exercise: Secondary | ICD-10-CM | POA: Insufficient documentation

## 2019-12-20 DIAGNOSIS — N183 Chronic kidney disease, stage 3 unspecified: Secondary | ICD-10-CM | POA: Insufficient documentation

## 2019-12-20 DIAGNOSIS — I129 Hypertensive chronic kidney disease with stage 1 through stage 4 chronic kidney disease, or unspecified chronic kidney disease: Secondary | ICD-10-CM | POA: Insufficient documentation

## 2019-12-20 DIAGNOSIS — Z794 Long term (current) use of insulin: Secondary | ICD-10-CM | POA: Diagnosis not present

## 2019-12-20 DIAGNOSIS — R5381 Other malaise: Secondary | ICD-10-CM

## 2019-12-20 DIAGNOSIS — Z950 Presence of cardiac pacemaker: Secondary | ICD-10-CM | POA: Diagnosis not present

## 2019-12-20 DIAGNOSIS — R5383 Other fatigue: Secondary | ICD-10-CM | POA: Diagnosis not present

## 2019-12-20 DIAGNOSIS — E1165 Type 2 diabetes mellitus with hyperglycemia: Secondary | ICD-10-CM | POA: Diagnosis not present

## 2019-12-20 DIAGNOSIS — I25119 Atherosclerotic heart disease of native coronary artery with unspecified angina pectoris: Secondary | ICD-10-CM | POA: Insufficient documentation

## 2019-12-20 DIAGNOSIS — R739 Hyperglycemia, unspecified: Secondary | ICD-10-CM

## 2019-12-20 DIAGNOSIS — Z96651 Presence of right artificial knee joint: Secondary | ICD-10-CM | POA: Insufficient documentation

## 2019-12-20 DIAGNOSIS — Z87891 Personal history of nicotine dependence: Secondary | ICD-10-CM | POA: Insufficient documentation

## 2019-12-20 LAB — BASIC METABOLIC PANEL
Anion gap: 6 (ref 5–15)
BUN: 23 mg/dL (ref 8–23)
CO2: 29 mmol/L (ref 22–32)
Calcium: 8.6 mg/dL — ABNORMAL LOW (ref 8.9–10.3)
Chloride: 99 mmol/L (ref 98–111)
Creatinine, Ser: 1.42 mg/dL — ABNORMAL HIGH (ref 0.61–1.24)
GFR calc Af Amer: 52 mL/min — ABNORMAL LOW (ref >60)
GFR calc non Af Amer: 45 mL/min — ABNORMAL LOW (ref >60)
Glucose, Bld: 335 mg/dL — ABNORMAL HIGH (ref 70–99)
Potassium: 4.1 mmol/L (ref 3.5–5.1)
Sodium: 134 mmol/L — ABNORMAL LOW (ref 135–145)

## 2019-12-20 LAB — CBC
HCT: 37.1 % — ABNORMAL LOW (ref 39.0–52.0)
Hemoglobin: 12 g/dL — ABNORMAL LOW (ref 13.0–17.0)
MCH: 30.7 pg (ref 26.0–34.0)
MCHC: 32.3 g/dL (ref 30.0–36.0)
MCV: 94.9 fL (ref 80.0–100.0)
Platelets: 163 10*3/uL (ref 150–400)
RBC: 3.91 MIL/uL — ABNORMAL LOW (ref 4.22–5.81)
RDW: 12.1 % (ref 11.5–15.5)
WBC: 6.9 10*3/uL (ref 4.0–10.5)
nRBC: 0 % (ref 0.0–0.2)

## 2019-12-20 LAB — CBG MONITORING, ED: Glucose-Capillary: 336 mg/dL — ABNORMAL HIGH (ref 70–99)

## 2019-12-20 NOTE — Telephone Encounter (Signed)
I reached out to Thomas Maldonado on 12/20/2019 at his preferred telephone number to discuss Transitional Care Management, medication reconciliation, and to schedule a TCM hospital follow-up with Claretta Fraise his PCP at [facility].  Discharge Date:  12/20/2019 Location:  [facility] Discharge diagnosis:  Acute respiratory failure with hypoxia  Recommendations for outpatient follow up:   Follow-up with Primary Care Physician  Referral Priority: Routine Referral Type: Consultation  Referral Reason: Evaluate and Return  Number of Visits Requested: 1 Expiration Date: 06/16/20   Ambulatory referral to Pulmonology  Referral Priority: Routine Referral Type: Consultation  Referral Reason: Evaluate and Return  Referred to Provider: Dionne Milo Requested Specialty: Pulmonary Diseases  Number of Visits Requested: 1 Expiration Date: 06/16/20   Consistent Carbohydrate Diet (diabetic)   Activity as tolerated   Plan:  I left a HIPPA compliant message for him to return my call.  Will attempt to contact again within the 2 days post discharge window if he does not return my call.

## 2019-12-20 NOTE — ED Triage Notes (Signed)
Pt in by [facility] for high blood sgar today.  Pt c/o generalized malaise,

## 2019-12-20 NOTE — ED Provider Notes (Signed)
[facility] EMERGENCY DEPARTMENT Provider Note   CSN: [id number] Arrival date & time: 12/20/19  2128     History Chief Complaint  Patient presents with  . Hyperglycemia    Thomas Maldonado is a 86 y.o. male.  HPI    86 year old comes in a chief complaint of elevated blood sugar. Pt has multiple medical comorbidities.  Patient was discharged from [facility] just earlier today.  His blood sugar was elevated and so family brought him into the ER.  Patient was admitted for pneumonia and has completed his antibiotic course.  His insurance would not cover home health.  Family reports that patient is quite weak.  He denies any new fevers, chills, UTI-like symptoms, cough.  Past Medical History:  Diagnosis Date  . Acute upper respiratory infections of unspecified site   . AV block    a. s/p [employer] PPM - [facility].  Marland Kitchen CAD (coronary artery disease)    a.  inferolateral MI 1999 s/p stent to Cx and DES to prox LAD 2013.  Marland Kitchen Chronic kidney disease, stage III (moderate)   . Coronary atherosclerosis of unspecified type of vessel, native or graft 1999  . Diabetes mellitus without complication ([place])   . Esophageal reflux   . Hypertension   . Nausea   . NSVT (nonsustained ventricular tachycardia) ([place])   . Other chronic pain   . Problems with hearing   . Problems with sight   . Pure hypercholesterolemia   . Type II or unspecified type diabetes mellitus with renal manifestations, not stated as uncontrolled(250.40)     Patient Active Problem List   Diagnosis Date Noted  . Elevated troponin   . Chest pain 05/25/2018  . CKD (chronic kidney disease) stage 3, GFR 30-59 ml/min 05/25/2018  . Essential hypertension 05/08/2018  . Eosinophilia 01/10/2018  . Diabetic neuropathy with neurologic complication ([place]) 35/00/9381  . Pure hypercholesterolemia 01/09/2018  . Coronary artery disease of native heart with stable angina pectoris ([place]) 01/09/2018  . Pacemaker 01/09/2018  . Cerebral ataxia  ([place]) 01/09/2018    Past Surgical History:  Procedure Laterality Date  . COLON SURGERY    . FOOT SURGERY Left 1982  . HERNIA REPAIR    . KNEE ARTHROSCOPY Right 2000  . LEFT HEART CATH AND CORONARY ANGIOGRAPHY N/A 05/26/2018   Procedure: LEFT HEART CATH AND CORONARY ANGIOGRAPHY;  Surgeon: Jettie Booze, MD;  Location: [facility];  Service: Cardiovascular;  Laterality: N/A;  . NECK SURGERY  2008   Plate in neck  . PACEMAKER INSERTION  2014  . SHOULDER SURGERY  2000  . TRANSURETHRAL PROSTATECTOMY WITH GYRUS INSTRUMENTS  1999       Family History  Problem Relation Age of Onset  . Diabetes Father   . Cancer Brother   . Heart disease Brother   . Diabetes Brother   . Cancer Brother   . Heart disease Brother   . Diabetes Brother   . Cancer Brother   . Heart disease Brother   . Diabetes Brother   . Cancer Brother   . Heart disease Brother   . Cancer Brother   . Heart disease Brother   . Cancer Brother   . Heart disease Brother   . Cancer Sister   . Heart disease Sister   . Diabetes Sister   . Cancer Sister   . Heart disease Sister   . Diabetes Sister   . Cancer Sister   . Heart disease Sister   . Cancer Sister   .  Heart disease Sister     Social History   Tobacco Use  . Smoking status: Former Smoker    Types: Cigarettes    Quit date: 11/02/1981    Years since quitting: 38.1  . Smokeless tobacco: Never Used  Vaping Use  . Vaping Use: Never used  Substance Use Topics  . Alcohol use: No  . Drug use: No    Home Medications Prior to Admission medications   Medication Sig Start Date End Date Taking? Authorizing Provider  acetaminophen (TYLENOL) 500 MG tablet Take 500-1,000 mg by mouth every 6 (six) hours as needed (FOR HEADACHES).    [provider]  aspirin 81 MG tablet Take 81 mg by mouth every evening.     [provider]  azelastine (ASTELIN) 0.1 % nasal spray Place 2 sprays into both nostrils 2 (two) times daily. Use in each  nostril as directed 11/02/18   Claretta Fraise, MD  blood glucose meter kit and supplies KIT Dispense based on patient and insurance preference. Use up to four times daily as directed. (FOR ICD-9 250.00, 250.01). 05/10/19   Claretta Fraise, MD  celecoxib (CELEBREX) 200 MG capsule Take 1 capsule by mouth once daily 12/11/19   Claretta Fraise, MD  diclofenac (VOLTAREN) 0.1 % ophthalmic solution Place 1 drop into both eyes 4 (four) times daily. 02/12/19   Claretta Fraise, MD  ezetimibe (ZETIA) 10 MG tablet Take 1 tablet (10 mg total) by mouth daily. 06/26/19   Claretta Fraise, MD  glucose blood test strip Use to test blood sugar 6 times daily as directed. DX E11.9. 200 test strips=33 day supply 11/22/19   Claretta Fraise, MD  Insulin Aspart, w/Niacinamide, (FIASP) 100 UNIT/ML SOLN Inject into the skin. With meals per sliding scale as directed    [provider]  insulin degludec (TRESIBA FLEXTOUCH) 200 UNIT/ML FlexTouch Pen Inject 50 Units into the skin in the morning. Titrate up to 60 units daily (3 pens=30 day supply 11/08/19   Claretta Fraise, MD  Insulin Pen Needle (NOVOFINE PLUS) 32G X 4 MM MISC Use daily with insulin Dx E11.40 12/12/19   Claretta Fraise, MD  Lancets (ACCU-CHEK SOFT TOUCH) lancets Use as instructed 05/05/18   Claretta Fraise, MD  lisinopril (ZESTRIL) 10 MG tablet Take 1 tablet (10 mg total) by mouth daily. 06/26/19   Claretta Fraise, MD  meclizine (ANTIVERT) 25 MG tablet Take 1 tablet (25 mg total) by mouth every 6 (six) hours as needed for dizziness or nausea. 06/24/18   Rolland Porter, MD  metoprolol tartrate (LOPRESSOR) 25 MG tablet Take 1/2 (one-half) tablet by mouth twice daily 06/26/19   Claretta Fraise, MD  omeprazole (PRILOSEC) 20 MG capsule Take 2 capsules (40 mg total) by mouth daily. 06/26/19   Claretta Fraise, MD  ondansetron (ZOFRAN-ODT) 4 MG disintegrating tablet Take 1 tablet (4 mg total) by mouth every 8 (eight) hours as needed for nausea or vomiting. 08/02/18   Claretta Fraise, MD   pravastatin (PRAVACHOL) 20 MG tablet Take 1 tablet (20 mg total) by mouth daily. 06/26/19   Claretta Fraise, MD  topiramate (TOPAMAX) 25 MG tablet Take 1 tablet (25 mg total) by mouth daily. 06/26/19   Claretta Fraise, MD  Vitamin D, Ergocalciferol, (DRISDOL) 1.25 MG (50000 UNIT) CAPS capsule Take 1 capsule (50,000 Units total) by mouth every Thursday. 06/28/19   Claretta Fraise, MD    Allergies    Ozempic (0.25 or 0.5 mg-dose) [semaglutide(0.25 or 0.68m-dos)], Metformin and related, and Prednisone  Review of Systems  Review of Systems  Constitutional: Positive for activity change and fatigue.  Respiratory: Negative for cough.   Cardiovascular: Negative for chest pain.  Gastrointestinal: Negative for abdominal pain.  Allergic/Immunologic: Negative for immunocompromised state.  All other systems reviewed and are negative.   Physical Exam Updated Vital Signs BP (!) 137/65   Pulse 84   Temp 98.5 F (36.9 C) (Oral)   Resp 17   Ht 6' (1.829 m)   Wt (!) 104.3 kg   SpO2 97%   BMI 31.19 kg/m   Physical Exam Vitals and nursing note reviewed.  Constitutional:      Appearance: He is well-developed.  HENT:     Head: Atraumatic.  Cardiovascular:     Rate and Rhythm: Normal rate.  Pulmonary:     Effort: Pulmonary effort is normal.  Musculoskeletal:     Cervical back: Neck supple.  Skin:    General: Skin is warm.  Neurological:     Mental Status: He is alert and oriented to person, place, and time.     ED Results / Procedures / Treatments   Labs (all labs ordered are listed, but only abnormal results are displayed) Labs Reviewed  BASIC METABOLIC PANEL - Abnormal; Notable for the following components:      Result Value   Sodium 134 (*)    Glucose, Bld 335 (*)    Creatinine, Ser 1.42 (*)    Calcium 8.6 (*)    GFR calc non Af Amer 45 (*)    GFR calc Af Amer 52 (*)    All other components within normal limits  CBC - Abnormal; Notable for the following components:   RBC 3.91 (*)     Hemoglobin 12.0 (*)    HCT 37.1 (*)    All other components within normal limits  CBG MONITORING, ED - Abnormal; Notable for the following components:   Glucose-Capillary 336 (*)    All other components within normal limits  URINALYSIS, ROUTINE W REFLEX MICROSCOPIC  CBG MONITORING, ED    EKG None  Radiology No results found.  Procedures Procedures (including critical care time)  Medications Ordered in ED Medications - No data to display  ED Course  I have reviewed the triage vital signs and the nursing notes.  Pertinent labs & imaging results that were available during my care of the patient were reviewed by me and considered in my medical decision making (see chart for details).    MDM Rules/Calculators/A&P                          86 year old comes in a chief complaint of weakness and elevated blood sugar.  Patient has mild hyperglycemia without ketosis. No treatment needed in the ER.  Patient does not have any underlying infectious process.  We will advise him to follow-up with his PCP and carefully log his blood sugars/  It appears that he is also physically deconditioned.  We will consult home health to see if he is eligible for any home health PT and evaluation.  Final Clinical Impression(s) / ED Diagnoses Final diagnoses:  Hyperglycemia  Physical deconditioning    Rx / DC Orders ED Discharge Orders    None       Varney Biles, MD 12/21/19 2356

## 2019-12-20 NOTE — Discharge Instructions (Addendum)
Please read instructions on blood glucose monitoring.  Recommend that you check your blood sugar at specific times and record them.  We would like you to see your primary care doctor in 1 week.  We have consulted home health so that they can assess you further.

## 2019-12-21 ENCOUNTER — Telehealth: Payer: Self-pay | Admitting: Pharmacist

## 2019-12-21 NOTE — Telephone Encounter (Signed)
TRANSITIONAL CARE MANAGEMENT TELEPHONE OUTREACH NOTE   Contact Date: 12/21/2019 Contacted By: Georgina Pillion, LPN   DISCHARGE INFORMATION Date of Discharge:12/20/2019 Discharge [facility]: [facility] Discharge Diagnosis:Acute Respiratory Failure and Hypoxia   Outpatient Follow Up Recommendations (copied from discharge summary)  Follow up with Thomas Fraise, MD (Family Medicine) in 1 week (12/27/2019)   Thomas Maldonado is a male primary care patient of Thomas Fraise, MD. An outgoing telephone call was made today and I spoke with patient himself.  Mr. Keating condition(s) and treatment(s) were discussed. An opportunity to ask questions was provided and all were answered or forwarded as appropriate.    ACTIVITIES OF DAILY LIVING  Thomas Maldonado lives alone and he can perform some ADLs independently. his primary caregiver is his daughter. he is able to depend on his primary caregiver(s) for consistent help. Transportation to appointments, to pick up medications, and to run errands is not a problem.  (Consider referral to [facility] if transportation or a consistent caregiver is a problem)   Fall Risk Fall Risk  09/25/2019 06/26/2019  Falls in the past year? 0 0  Number falls in past yr: 0 0  Injury with Fall? 0 0  Risk for fall due to : Impaired balance/gait No Fall Risks  Follow up Falls evaluation completed Falls evaluation completed    medium [place] Modifications/Assistive Devices Wheelchair: No Cane: No Ramp: No Bedside Toilet: No Hospital Bed:  No Other: walker   [place] he is not receiving home health  services.     MEDICATION RECONCILIATION  Mr. Weatherbee has been able to pick-up all prescribed discharge medications from the pharmacy.   A post discharge medication reconciliation was performed and the complete medication list was reviewed with the patient/caregiver and is current as of 12/21/2019. Changes highlighted  below.  Discontinued Medications none  Current Medication List Allergies as of 12/20/2019      Reactions   Ozempic (0.25 Or 0.5 Mg-dose) [semaglutide(0.25 Or 0.90m-dos)] Nausea And Vomiting   Nausea with ozempic (tried for 4 weeks(   Metformin And Related Nausea Only   Prednisone Anxiety   Pt reports "with all steriods gets anxious, tremors, and tears me all to pieces"      Medication List       Accurate as of December 20, 2019 11:59 PM. If you have any questions, ask your nurse or doctor.        accu-chek soft touch lancets Use as instructed   acetaminophen 500 MG tablet Commonly known as: TYLENOL Take 500-1,000 mg by mouth every 6 (six) hours as needed (FOR HEADACHES).   aspirin 81 MG tablet Take 81 mg by mouth every evening.   azelastine 0.1 % nasal spray Commonly known as: ASTELIN Place 2 sprays into both nostrils 2 (two) times daily. Use in each nostril as directed   blood glucose meter kit and supplies Kit Dispense based on patient and insurance preference. Use up to four times daily as directed. (FOR ICD-9 250.00, 250.01).   celecoxib 200 MG capsule Commonly known as: CELEBREX Take 1 capsule by mouth once daily   diclofenac 0.1 % ophthalmic solution Commonly known as: VOLTAREN Place 1 drop into both eyes 4 (four) times daily.   ezetimibe 10 MG tablet Commonly known as: ZETIA Take 1 tablet (10 mg total) by mouth daily.   Fiasp 100 UNIT/ML Soln Generic drug: Insulin Aspart (w/Niacinamide) Inject into the skin. With meals per sliding scale as directed  glucose blood test strip Use to test blood sugar 6 times daily as directed. DX E11.9. 200 test strips=33 day supply   lisinopril 10 MG tablet Commonly known as: ZESTRIL Take 1 tablet (10 mg total) by mouth daily.   meclizine 25 MG tablet Commonly known as: ANTIVERT Take 1 tablet (25 mg total) by mouth every 6 (six) hours as needed for dizziness or nausea.   metoprolol tartrate 25 MG tablet Commonly  known as: LOPRESSOR Take 1/2 (one-half) tablet by mouth twice daily   NovoFine Plus 32G X 4 MM Misc Generic drug: Insulin Pen Needle Use daily with insulin Dx E11.40   omeprazole 20 MG capsule Commonly known as: PRILOSEC Take 2 capsules (40 mg total) by mouth daily.   ondansetron 4 MG disintegrating tablet Commonly known as: ZOFRAN-ODT Take 1 tablet (4 mg total) by mouth every 8 (eight) hours as needed for nausea or vomiting.   pravastatin 20 MG tablet Commonly known as: PRAVACHOL Take 1 tablet (20 mg total) by mouth daily.   topiramate 25 MG tablet Commonly known as: TOPAMAX Take 1 tablet (25 mg total) by mouth daily.   Tyler Aas FlexTouch 200 UNIT/ML FlexTouch Pen Generic drug: insulin degludec Inject 50 Units into the skin in the morning. Titrate up to 60 units daily (3 pens=30 day supply   Vitamin D (Ergocalciferol) 1.25 MG (50000 UNIT) Caps capsule Commonly known as: DRISDOL Take 1 capsule (50,000 Units total) by mouth every Thursday.        PATIENT EDUCATION & FOLLOW-UP PLAN  An appointment for Transitional Care Management is scheduled with Chevis Pretty, FNP on 12/27/2019 at 11:15am.  Take all medications as prescribed  Contact our office by calling [phone] if you have any questions or concerns

## 2019-12-21 NOTE — Telephone Encounter (Signed)
BG>600 Instructed patient to use sliding scale  Discussed with MD BG still elevated down to 500, however patient is dizzy Recommend patient go to ER to rule out DKA

## 2019-12-27 ENCOUNTER — Encounter: Payer: Self-pay | Admitting: Nurse Practitioner

## 2019-12-27 ENCOUNTER — Telehealth (INDEPENDENT_AMBULATORY_CARE_PROVIDER_SITE_OTHER): Payer: Medicare Other | Admitting: Nurse Practitioner

## 2019-12-27 ENCOUNTER — Telehealth: Payer: Self-pay | Admitting: *Deleted

## 2019-12-27 ENCOUNTER — Telehealth: Payer: Self-pay | Admitting: Pharmacist

## 2019-12-27 DIAGNOSIS — J9621 Acute and chronic respiratory failure with hypoxia: Secondary | ICD-10-CM | POA: Diagnosis not present

## 2019-12-27 DIAGNOSIS — Z7689 Persons encountering health services in other specified circumstances: Secondary | ICD-10-CM

## 2019-12-27 NOTE — Chronic Care Management (AMB) (Signed)
  Chronic Care Management   Note  12/27/2019 Name: Thomas Maldonado MRN: [id number] DOB: 12-Dec-1933  Thomas Maldonado is a 86 y.o. year old male who is a primary care patient of Stacks, Cletus Gash, MD. I reached out to Fawn Kirk by phone today in response to a referral sent by Thomas Maldonado PCP, Claretta Fraise, MD.     Thomas Maldonado was given information about Chronic Care Management services today including:  1. CCM service includes personalized support from designated clinical staff supervised by his physician, including individualized plan of care and coordination with other care providers 2. 24/7 contact phone numbers for assistance for urgent and routine care needs. 3. Service will only be billed when office clinical staff spend 20 minutes or more in a month to coordinate care. 4. Only one practitioner may furnish and bill the service in a calendar month. 5. The patient may stop CCM services at any time (effective at the end of the month) by phone call to the office staff. 6. The patient will be responsible for cost sharing (co-pay) of up to 20% of the service fee (after annual deductible is met).  [name]  verbally agreed to assistance and services provided by embedded care coordination/care management team today.  Follow up plan: Telephone appointment with care management team member scheduled for: 01/07/2020  [facility]  [place], [place] [id number] Direct Dial: [email or web address] Website: [email or web address]

## 2019-12-27 NOTE — Progress Notes (Signed)
   Virtual Visit via video Note   Due to COVID-19 pandemic this visit was conducted virtually. This visit type was conducted due to national recommendations for restrictions regarding the COVID-19 Pandemic (e.g. social distancing, sheltering in place) in an effort to limit this patient's exposure and mitigate transmission in our community. All issues noted in this document were discussed and addressed.  A physical exam was not performed with this format.  I connected with  Fawn Kirk  on 12/27/19 at 11/:25 by video and verified that: I am speaking with the correct person using two identifiers. Manav Pierotti is currently located at home and grand  is currently with him  during visit. The provider, Mary-Margaret Hassell Done, FNP is located in their office at time of visit.  I discussed the limitations, risks, security and privacy concerns of performing an evaluation and management service by telephone and the availability of in person appointments. I also discussed with the patient that there may be a patient responsible charge related to this service. The patient expressed understanding and agreed to proceed.   History and Present Illness:   Chief Complaint: Transitions Of Care   HPI  Today's visit was for Transitional Care Management.  The patient was discharged from [facility] on 12/20/19 with a primary diagnosis of Acute respiratory failure.   Contact with the patient and/or caregiver, by a clinical staff member, was made on 12/21/19 and was documented as a telephone encounter within the EMR.  Through chart review and discussion with the patient I have determined that management of their condition is of moderate complexity.    He is not doing very well. He feels terrible, will not et out of bed. Has not been eating or drinking well. He is dizzy when he tries to get get up      Review of Systems  Constitutional: Positive for malaise/fatigue and weight loss.  HENT: Negative.     Respiratory: Negative.   Cardiovascular: Negative.   Genitourinary: Negative.   Neurological: Positive for dizziness and weakness.  Psychiatric/Behavioral: The patient is nervous/anxious.        Observations/Objective: Alert and oriented- does not answer any questions Moderate distress- breathing heavy and is on oxygen    Assessment and Plan: Gregroy Dombkowski in today with chief complaint of Transitions Of Care   1. Encounter for support and coordination of transition of care   2. Acute on chronic respiratory failure with hypoxia [facility]) Hospice referral to be evaluated - Ambulatory referral to Hospice      Follow Up Instructions: Jan 01, 2020 with DR. Stacks    I discussed the assessment and treatment plan with the patient. The patient was provided an opportunity to ask questions and all were answered. The patient agreed with the plan and demonstrated an understanding of the instructions.   The patient was advised to call back or seek an in-person evaluation if the symptoms worsen or if the condition fails to improve as anticipated.  The above assessment and management plan was discussed with the patient. The patient verbalized understanding of and has agreed to the management plan. Patient is aware to call the clinic if symptoms persist or worsen. Patient is aware when to return to the clinic for a follow-up visit. Patient educated on when it is appropriate to go to the emergency department.   Time call ended:11:55  I provided 25 minutes of face-to-face time during this encounter.    Mary-Margaret Hassell Done, FNP

## 2019-12-27 NOTE — Chronic Care Management (AMB) (Signed)
[name] was wanting advice on what to feed her dad since he has a sore mouth and loss of appetite. Sent message to CCM Team was advised to call daughter and have her call to [facility] and put back a message to the Triage nurse. Tammy understood and thanked me for the call and told me she would call office at [facility] and make Dr Livia Snellen aware of problem through Triage nurse. [place], [place] [address] Direct Dial: [phone] [email or web address] Website: [email or web address]

## 2019-12-27 NOTE — Telephone Encounter (Signed)
Patient's daughter interested in [place] CGM for patient Encouraged her to make appt for this service in order to demonstrate proper use

## 2019-12-28 ENCOUNTER — Telehealth: Payer: Self-pay | Admitting: *Deleted

## 2019-12-28 ENCOUNTER — Telehealth: Payer: Self-pay | Admitting: Family Medicine

## 2019-12-28 DIAGNOSIS — I959 Hypotension, unspecified: Secondary | ICD-10-CM | POA: Diagnosis not present

## 2019-12-28 DIAGNOSIS — E1165 Type 2 diabetes mellitus with hyperglycemia: Secondary | ICD-10-CM | POA: Diagnosis not present

## 2019-12-28 DIAGNOSIS — R0602 Shortness of breath: Secondary | ICD-10-CM | POA: Diagnosis not present

## 2019-12-28 DIAGNOSIS — R069 Unspecified abnormalities of breathing: Secondary | ICD-10-CM | POA: Diagnosis not present

## 2019-12-28 MED ORDER — MORPHINE SULFATE 20 MG/5ML PO SOLN: 2.5000 mg | ORAL | 0 refills | Status: AC | PRN

## 2019-12-28 NOTE — Telephone Encounter (Signed)
Thomas Maldonado is aware but needs you to send the rx to [facility].

## 2019-12-28 NOTE — Telephone Encounter (Signed)
Ok to order 

## 2019-12-28 NOTE — Telephone Encounter (Signed)
Call from [facility] Would like order for liquid Morphine for SOB  20 mg/ml 0.25 q2 hr prn SOB to [place] Please call Beth back

## 2019-12-28 NOTE — Addendum Note (Signed)
Addended by: Evelina Dun A on: 12/28/2019 03:23 PM   Modules accepted: Orders

## 2019-12-28 NOTE — Telephone Encounter (Signed)
Prescription sent to pharmacy

## 2020-01-01 ENCOUNTER — Ambulatory Visit: Payer: Medicare Other | Admitting: Family Medicine

## 2020-01-02 ENCOUNTER — Encounter: Payer: Self-pay | Admitting: Family Medicine

## 2020-01-03 ENCOUNTER — Ambulatory Visit: Payer: Medicare Other | Admitting: Nurse Practitioner

## 2020-01-04 NOTE — Telephone Encounter (Signed)
LIBRE 2 cgm GIVEN TO PATIENT

## 2020-01-07 ENCOUNTER — Ambulatory Visit (INDEPENDENT_AMBULATORY_CARE_PROVIDER_SITE_OTHER): Payer: Medicare Other | Admitting: Licensed Clinical Social Worker

## 2020-01-07 DIAGNOSIS — E1149 Type 2 diabetes mellitus with other diabetic neurological complication: Secondary | ICD-10-CM | POA: Diagnosis not present

## 2020-01-07 DIAGNOSIS — I10 Essential (primary) hypertension: Secondary | ICD-10-CM | POA: Diagnosis not present

## 2020-01-07 DIAGNOSIS — G119 Hereditary ataxia, unspecified: Secondary | ICD-10-CM

## 2020-01-07 DIAGNOSIS — E1140 Type 2 diabetes mellitus with diabetic neuropathy, unspecified: Secondary | ICD-10-CM

## 2020-01-07 DIAGNOSIS — I25118 Atherosclerotic heart disease of native coronary artery with other forms of angina pectoris: Secondary | ICD-10-CM

## 2020-01-07 NOTE — Patient Instructions (Addendum)
Licensed Clinical Social Worker Visit Information  Goals we discussed today:  Goals Addressed              This Visit's Progress   .  Client will talk with LCSW in next 4 weeks about health needs of client and client completion of daily activities (pt-stated)        CARE PLAN ENTRY   Current Barriers:  . Patient with chronic diagnoses of CKD, Diabetic neuropathy, CAD, HTN, Cerebral Ataxia  Clinical Social Work Clinical Goal(s):  Marland Kitchen LCSW will call client in next 4 weeks to talk about health needs of client and client completion of daily activites  Interventions:  Talked with daughter, Thomas Maldonado, about fatigue of client Talked with Thomas Maldonado about client completion of ADLs Talked with Thomas Maldonado about client appetite Talked with Thomas Maldonado about pain issues of client Talked with Thomas Maldonado about vision issues of client Talked with Thomas Maldonado about mobility of client (uses a walker to ambulate) Talked with Thomas Maldonado about Hospice assistance with client Talked with Thomas Maldonado about client use of oxygen Talked with Thomas Maldonado about client occasional shortness of breath Talked with Thomas Maldonado about sleeping challenges of client Talked with Thomas Maldonado about client's upcoming appointments Talked with Thomas Maldonado about DME of client (uses hospital bed, overbed table, and oxygen equipment) Talked with Thomas Maldonado about RNCM support with CCM program  Patient Self Care Activities:   Eats meals with set up assistance  Patient Self Care Deficits:    Mobility issues Pain issues [facility] care  Initial goal documentation       Materials Provided: No  Follow Up Plan: LCSW to talk with client/Thomas Maldonado, daughter, in next 4 weeks about health needs of client and client completion of daily activities  The patient Thomas Maldonado, daughter, verbalized understanding of instructions provided today and declined a print copy of patient instruction materials.   Norva Riffle.Myleka Moncure MSW, LCSW Licensed Clinical Social Worker [facility] Care Management [phone]

## 2020-01-07 NOTE — Chronic Care Management (AMB) (Signed)
Chronic Care Management    Clinical Social Work Follow Up Note  01/07/2020 Name: Brennin Durfee MRN: [id number] DOB: [birth year]  Huie Ghuman is a 86 y.o. year old male who is a primary care patient of Stacks, Cletus Gash, MD. The CCM team was consulted for assistance with [employer] .   Review of patient status, including review of consultants reports, other relevant assessments, and collaboration with appropriate care team members and the patient's provider was performed as part of comprehensive patient evaluation and provision of chronic care management services.    SDOH (Social Determinants of Health) assessments performed: Yes; risk for tobacco use; risk for depression; risk for stress; risk for physical inactivity  SDOH Interventions     Most Recent Value  SDOH Interventions  Depression Interventions/Treatment  --  [informed daughter, Monico Hoar, of RNCM support and LCSW support]        Chronic Care Management from 01/07/2020 in [place]  PHQ-9 Total Score 7      GAD 7 : Generalized Anxiety Score 01/07/2020  Nervous, Anxious, on Edge 1  Control/stop worrying 0  Worry too much - different things 0  Trouble relaxing 0  Restless 0  Easily annoyed or irritable 0  Afraid - awful might happen 0  Total GAD 7 Score 1  Anxiety Difficulty Somewhat difficult    Outpatient Encounter Medications as of 01/07/2020  Medication Sig  . acetaminophen (TYLENOL) 500 MG tablet Take 500-1,000 mg by mouth every 6 (six) hours as needed (FOR HEADACHES).  Marland Kitchen aspirin 81 MG tablet Take 81 mg by mouth every evening.   Marland Kitchen azelastine (ASTELIN) 0.1 % nasal spray Place 2 sprays into both nostrils 2 (two) times daily. Use in each nostril as directed  . blood glucose meter kit and supplies KIT Dispense based on patient and insurance preference. Use up to four times daily as directed. (FOR ICD-9 250.00, 250.01).  . celecoxib (CELEBREX) 200 MG capsule Take 1 capsule by mouth once  daily  . diclofenac (VOLTAREN) 0.1 % ophthalmic solution Place 1 drop into both eyes 4 (four) times daily.  Marland Kitchen ezetimibe (ZETIA) 10 MG tablet Take 1 tablet (10 mg total) by mouth daily.  Marland Kitchen glucose blood test strip Use to test blood sugar 6 times daily as directed. DX E11.9. 200 test strips=33 day supply  . Insulin Aspart, w/Niacinamide, (FIASP) 100 UNIT/ML SOLN Inject into the skin. With meals per sliding scale as directed  . insulin degludec (TRESIBA FLEXTOUCH) 200 UNIT/ML FlexTouch Pen Inject 50 Units into the skin in the morning. Titrate up to 60 units daily (3 pens=30 day supply  . Insulin Pen Needle (NOVOFINE PLUS) 32G X 4 MM MISC Use daily with insulin Dx E11.40  . Lancets (ACCU-CHEK SOFT TOUCH) lancets Use as instructed  . lisinopril (ZESTRIL) 10 MG tablet Take 1 tablet (10 mg total) by mouth daily.  . meclizine (ANTIVERT) 25 MG tablet Take 1 tablet (25 mg total) by mouth every 6 (six) hours as needed for dizziness or nausea.  . metoprolol tartrate (LOPRESSOR) 25 MG tablet Take 1/2 (one-half) tablet by mouth twice daily  . morphine 20 MG/5ML solution Take 0.6 mLs (2.4 mg total) by mouth every 2 (two) hours as needed for pain.  Marland Kitchen omeprazole (PRILOSEC) 20 MG capsule Take 2 capsules (40 mg total) by mouth daily.  . ondansetron (ZOFRAN-ODT) 4 MG disintegrating tablet Take 1 tablet (4 mg total) by mouth every 8 (eight) hours as needed for nausea or vomiting.  . pravastatin (  PRAVACHOL) 20 MG tablet Take 1 tablet (20 mg total) by mouth daily.  Marland Kitchen topiramate (TOPAMAX) 25 MG tablet Take 1 tablet (25 mg total) by mouth daily.  . Vitamin D, Ergocalciferol, (DRISDOL) 1.25 MG (50000 UNIT) CAPS capsule Take 1 capsule (50,000 Units total) by mouth every Thursday.   No facility-administered encounter medications on file as of 01/07/2020.     Goals Addressed              This Visit's Progress   .  Client will talk with LCSW in next 4 weeks about health needs of client and client completion of daily  activities (pt-stated)        CARE PLAN ENTRY   Current Barriers:  . Patient with chronic diagnoses of CKD, Diabetic neuropathy, CAD, HTN, Cerebral Ataxia  Clinical Social Work Clinical Goal(s):  Marland Kitchen LCSW will call client in next 4 weeks to talk about health needs of client and client completion of daily activites  Interventions:  Talked with daughter, Monico Hoar, about fatigue of client Talked with Inez Catalina about client completion of ADLs Talked with Inez Catalina about client appetite Talked with Inez Catalina about pain issues of client Talked with Inez Catalina about mobility of client (uses a walker to ambulate) Talked with Inez Catalina about Hospice assistance with client Talked with Inez Catalina about client use of oxygen Talked with Inez Catalina about client occasional shortness of breath Talked with Inez Catalina about sleeping challenges of client Talked with Inez Catalina about client's upcoming appointments Talked with Inez Catalina about DME of client (uses hospital bed, overbed table, and oxygen equipment) Talked with Inez Catalina about RNCM support with CCM program  Patient Self Care Activities:   Eats meals with set up assistance  Patient Self Care Deficits:    Mobility issues Pain issues [facility] care  Initial goal documentation      Follow Up Plan: LCSW to talk with client/Betty Tamala Julian, daughter in next 4 weeks about health needs of client and client completion of daily activities  Norva Riffle.Yousuf Ager MSW, LCSW Licensed Clinical Social Worker [facility] Care Management [phone]

## 2020-01-08 ENCOUNTER — Telehealth: Payer: Self-pay | Admitting: Family Medicine

## 2020-01-09 ENCOUNTER — Telehealth: Payer: Self-pay | Admitting: Family Medicine

## 2020-01-09 MED ORDER — ONETOUCH DELICA LANCETS 30G MISC: 11 refills | Status: AC

## 2020-01-09 MED ORDER — FREESTYLE LIBRE 2 SENSOR MISC: 3 refills | Status: DC

## 2020-01-09 MED ORDER — ONETOUCH VERIO VI STRP: ORAL_STRIP | 12 refills | Status: AC

## 2020-01-09 MED ORDER — ONETOUCH VERIO FLEX SYSTEM W/DEVICE KIT: PACK | 0 refills | Status: AC

## 2020-01-09 NOTE — Telephone Encounter (Signed)
New meter and testing supplies called in to [facility] in [place] per daughter/granddaughter request

## 2020-01-09 NOTE — Telephone Encounter (Signed)
°  Prescription Request  01/09/2020  What is the name of the medication or equipment? insulin degludec (TRESIBA FLEXTOUCH) 200 UNIT/ML FlexTouch Pen  Have you contacted your pharmacy to request a refill? (if applicable) no  Which pharmacy would you like this sent to? [facility]    Patient notified that their request is being sent to the clinical staff for review and that they should receive a response within 2 business days.

## 2020-01-09 NOTE — Telephone Encounter (Signed)
Pt no showed to his last appt 01/01/20 and has not rescheduled. Ok to refill?

## 2020-01-09 NOTE — Telephone Encounter (Signed)
Yes, he was in the hospital then, and now admitted t hospice.

## 2020-01-10 ENCOUNTER — Other Ambulatory Visit: Payer: Self-pay | Admitting: Family Medicine

## 2020-01-10 MED ORDER — TRESIBA FLEXTOUCH 200 UNIT/ML ~~LOC~~ SOPN: 50.0000 [IU] | PEN_INJECTOR | Freq: Every morning | SUBCUTANEOUS | 3 refills | Status: AC

## 2020-01-10 NOTE — Telephone Encounter (Signed)
Can we go ahead and send refills to [facility] in [place] since Dr Livia Snellen approved to have refills sent in for pt.

## 2020-01-10 NOTE — Telephone Encounter (Signed)
Tammy Mullane notified and verbalized understanding. Sent [place] to [facility] in [place]. Tammy is unsure what meds he takes and if he needs any other refills.

## 2020-01-11 NOTE — Telephone Encounter (Signed)
Looks like patient is on Hospice. Didn't know how you wanted to handle and if they needed and office visit. Please advise

## 2020-01-14 ENCOUNTER — Telehealth: Payer: Self-pay | Admitting: *Deleted

## 2020-01-14 NOTE — Telephone Encounter (Signed)
Kim w/ Hospice Wanting to know if it is time to DC [place] BS was 34 about 15 mins ago, Tyler Aas of 50u was given around 8 am after breakfast Apple sauce, Pepsi, cookies were given and BS came up to 60 BS readings have been running low Please advise

## 2020-01-14 NOTE — Telephone Encounter (Signed)
Try taking 1/2 dose daily.

## 2020-01-14 NOTE — Telephone Encounter (Signed)
Renold Don with Hospice of provider feedback and she voiced understanding.

## 2020-01-17 ENCOUNTER — Other Ambulatory Visit: Payer: Self-pay | Admitting: Family Medicine

## 2020-01-18 ENCOUNTER — Telehealth: Payer: Self-pay | Admitting: Pharmacist

## 2020-01-18 MED ORDER — FREESTYLE LIBRE 2 READER DEVI: 0 refills | Status: AC

## 2020-01-18 MED ORDER — FREESTYLE LIBRE 2 SENSOR MISC: 3 refills | Status: AC

## 2020-01-18 NOTE — Telephone Encounter (Signed)
Attempting to call in [place] [facility] to [facility]--will likely need to send to [facility] for DME

## 2020-01-19 ENCOUNTER — Other Ambulatory Visit: Payer: Self-pay | Admitting: Family Medicine

## 2020-01-22 MED ORDER — OXYCODONE-ACETAMINOPHEN 10-325 MG PO TABS: ORAL_TABLET | ORAL | 0 refills | Status: DC

## 2020-01-22 NOTE — Telephone Encounter (Signed)
HE is on Hospice. Do I still refuse?

## 2020-01-22 NOTE — Addendum Note (Signed)
Addended by: Antonietta Barcelona D on: 01/22/2020 02:32 PM   Modules accepted: Orders

## 2020-01-25 ENCOUNTER — Other Ambulatory Visit (HOSPITAL_COMMUNITY)
Admission: AD | Admit: 2020-01-25 | Discharge: 2020-01-25 | Disposition: A | Discharge status: Home / Self Care | Payer: Medicare Other | Source: Skilled Nursing Facility | Attending: Family Medicine | Admitting: Family Medicine

## 2020-01-25 ENCOUNTER — Telehealth: Payer: Self-pay | Admitting: Family Medicine

## 2020-01-25 DIAGNOSIS — R300 Dysuria: Secondary | ICD-10-CM | POA: Diagnosis not present

## 2020-01-25 NOTE — Telephone Encounter (Signed)
Please give verbal order for UA with culture.

## 2020-01-25 NOTE — Telephone Encounter (Signed)
Arbie Cookey calling to request an verbal order for a UA for this pt or to have antibiotic called in for a UTI.

## 2020-01-25 NOTE — Telephone Encounter (Signed)
Spoke with Arbie Cookey at [facility] and gave verbal order per provider.

## 2020-01-27 LAB — URINE CULTURE: Culture: NO GROWTH

## 2020-01-29 ENCOUNTER — Other Ambulatory Visit: Payer: Self-pay | Admitting: Family Medicine

## 2020-01-30 ENCOUNTER — Other Ambulatory Visit: Payer: Self-pay | Admitting: Family Medicine

## 2020-01-30 ENCOUNTER — Telehealth: Payer: Self-pay | Admitting: *Deleted

## 2020-01-30 MED ORDER — MOXIFLOXACIN HCL 400 MG PO TABS: 400.0000 mg | ORAL_TABLET | Freq: Every day | ORAL | 0 refills | Status: AC

## 2020-01-30 NOTE — Telephone Encounter (Signed)
Thomas Maldonado aware and verbalized understanding

## 2020-01-30 NOTE — Telephone Encounter (Signed)
I sent Avelox.  [place]

## 2020-01-30 NOTE — Telephone Encounter (Signed)
Wanting to know about treating pt with a broad spectrum abx Urine came back negative. Pt has chronic pulmonary issues, nurse does not hear any wheezing, he is not eating, is hallucinating. BP has been about 106/66, he has not been out of the house to come in contact w/ Covid, does have a family member that works outside of the house, but has not had any symptoms. Nurse is seeing him 2x a week. Says his nebulizer is not helping he is not very compliant with it

## 2020-02-01 ENCOUNTER — Telehealth: Payer: Self-pay | Admitting: Family Medicine

## 2020-02-01 NOTE — Telephone Encounter (Signed)
RX for strips sent 01/09/2020 for #200 test up to 6x daily with 12 refills to [facility] - called the patient to discuss LMTCB

## 2020-02-01 NOTE — Telephone Encounter (Signed)
  Prescription Request  02/01/2020  What is the name of the medication or equipment? Pt is testing his sugar more and needs more strips for onetouch called [facility] [place]  Have you contacted your pharmacy to request a refill? (if applicable) no  Which pharmacy would you like this sent to? [facility] [place]   Patient notified that their request is being sent to the clinical staff for review and that they should receive a response within 2 business days.

## 2020-02-12 ENCOUNTER — Ambulatory Visit: Admitting: Licensed Clinical Social Worker

## 2020-02-12 DIAGNOSIS — I25118 Atherosclerotic heart disease of native coronary artery with other forms of angina pectoris: Secondary | ICD-10-CM

## 2020-02-12 DIAGNOSIS — E1140 Type 2 diabetes mellitus with diabetic neuropathy, unspecified: Secondary | ICD-10-CM

## 2020-02-12 DIAGNOSIS — R0902 Hypoxemia: Secondary | ICD-10-CM | POA: Diagnosis not present

## 2020-02-12 DIAGNOSIS — I10 Essential (primary) hypertension: Secondary | ICD-10-CM

## 2020-02-12 DIAGNOSIS — R404 Transient alteration of awareness: Secondary | ICD-10-CM | POA: Diagnosis not present

## 2020-02-12 DIAGNOSIS — Z743 Need for continuous supervision: Secondary | ICD-10-CM | POA: Diagnosis not present

## 2020-02-12 DIAGNOSIS — G119 Hereditary ataxia, unspecified: Secondary | ICD-10-CM

## 2020-02-12 NOTE — Patient Instructions (Addendum)
Licensed Clinical Social Worker Visit Information  Goals we discussed today:  .  Client will talk with LCSW in next 4 weeks about health needs of client and client completion of daily activities (pt-stated)        CARE PLAN ENTRY   Current Barriers:   Patient with chronic diagnoses of CKD, Diabetic neuropathy, CAD, HTN, Cerebral Ataxia  Clinical Social Work Clinical Goal(s):   LCSW will call client in next 4 weeks to talk about health needs of client and client completion of daily activites  Interventions:  Talked with daughter, Monico Hoar, about fatigue of client Talked with Inez Catalina about client appetite Talked with Inez Catalina about pain issues of client Talked with Inez Catalina about vision issues of client Talked with Inez Catalina about mobility of client (uses a walker to ambulate) Talked with Inez Catalina about Hospice assistance with client Talked with Inez Catalina about client use of oxygen Talked with Inez Catalina about client occasional shortness of breath Talked with Inez Catalina about sleeping challenges of client Talked with Inez Catalina about client's upcoming appointments Talked with Inez Catalina about DME of client (uses walker, overbed table, and oxygen equipment) Collaborated with [facility] Triage Nurse regarding needs of client  Patient Self Care Activities:   Eats meals with set up assistance  Patient Self Care Deficits:    Mobility issues Pain issues [facility] care  Initial goal documentation    Follow Up Plan: LCSW to talk with client/Betty Tamala Julian, daughterin next 4 weeks about health needs of client and client completion of daily activities  Materials Provided: No  The patient/ Monico Hoar, daughter of patient,  verbalized understanding of instructions provided today and declined a print copy of patient instruction materials.   Norva Riffle.Artha Stavros MSW, LCSW Licensed Clinical Social Worker [facility] Care [facility] [phone]

## 2020-02-12 NOTE — Chronic Care Management (AMB) (Signed)
Chronic Care Management    Clinical Social Work Follow Up Note  02/12/2020 Name: Armel Rabbani MRN: [id number] DOB: [birth year]  Demauri Advincula is a 86 y.o. year old male who is a primary care patient of Stacks, Cletus Gash, MD. The CCM team was consulted for assistance with [employer] .   Review of patient status, including review of consultants reports, other relevant assessments, and collaboration with appropriate care team members and the patient's provider was performed as part of comprehensive patient evaluation and provision of chronic care management services.    SDOH (Social Determinants of Health) assessments performed: No;risk for tobacco use; risk for depression; risk for stress; risk for physical inactivity    Chronic Care Management from 01/07/2020 in [place]  PHQ-9 Total Score 7        GAD 7 : Generalized Anxiety Score 01/07/2020  Nervous, Anxious, on Edge 1  Control/stop worrying 0  Worry too much - different things 0  Trouble relaxing 0  Restless 0  Easily annoyed or irritable 0  Afraid - awful might happen 0  Total GAD 7 Score 1  Anxiety Difficulty Somewhat difficult    Outpatient Encounter Medications as of 02/12/2020  Medication Sig  . acetaminophen (TYLENOL) 500 MG tablet Take 500-1,000 mg by mouth every 6 (six) hours as needed (FOR HEADACHES).  Marland Kitchen aspirin 81 MG tablet Take 81 mg by mouth every evening.   Marland Kitchen azelastine (ASTELIN) 0.1 % nasal spray Place 2 sprays into both nostrils 2 (two) times daily. Use in each nostril as directed  . blood glucose meter kit and supplies KIT Dispense based on patient and insurance preference. Use up to four times daily as directed. (FOR ICD-9 250.00, 250.01).  . Blood Glucose Monitoring Suppl (ONETOUCH VERIO FLEX SYSTEM) w/Device KIT Use to test sugar up to 6 times daily. DX: E11.65  . celecoxib (CELEBREX) 200 MG capsule Take 1 capsule by mouth once daily  . Continuous Blood Gluc Receiver (FREESTYLE  LIBRE 2 READER) DEVI Use to test blood sugar up to 6 times daily. DX: E11.9  . Continuous Blood Gluc Sensor (FREESTYLE LIBRE 2 SENSOR) MISC Use to test blood sugar up to 6 times daily. DX: E 11.65  . diclofenac (VOLTAREN) 0.1 % ophthalmic solution Place 1 drop into both eyes 4 (four) times daily.  Marland Kitchen ezetimibe (ZETIA) 10 MG tablet Take 1 tablet (10 mg total) by mouth daily.  Marland Kitchen glucose blood (ONETOUCH VERIO) test strip Use to test sugar up to 6 times daily. DX: E11.65  . Insulin Aspart, w/Niacinamide, (FIASP) 100 UNIT/ML SOLN Inject into the skin. With meals per sliding scale as directed  . insulin degludec (TRESIBA FLEXTOUCH) 200 UNIT/ML FlexTouch Pen Inject 50 Units into the skin in the morning. Titrate up to 60 units daily (3 pens=30 day supply  . Insulin Pen Needle (NOVOFINE PLUS) 32G X 4 MM MISC Use daily with insulin Dx E11.40  . lisinopril (ZESTRIL) 10 MG tablet Take 1 tablet (10 mg total) by mouth daily.  . meclizine (ANTIVERT) 25 MG tablet Take 1 tablet (25 mg total) by mouth every 6 (six) hours as needed for dizziness or nausea.  . metoprolol tartrate (LOPRESSOR) 25 MG tablet Take 1/2 (one-half) tablet by mouth twice daily  . morphine 20 MG/5ML solution Take 0.6 mLs (2.4 mg total) by mouth every 2 (two) hours as needed for pain.  Marland Kitchen moxifloxacin (AVELOX) 400 MG tablet Take 1 tablet (400 mg total) by mouth daily. Take all of these, for  infection  . omeprazole (PRILOSEC) 20 MG capsule Take 2 capsules (40 mg total) by mouth daily.  . ondansetron (ZOFRAN-ODT) 4 MG disintegrating tablet Take 1 tablet (4 mg total) by mouth every 8 (eight) hours as needed for nausea or vomiting.  Glory Rosebush Delica Lancets 67P MISC Use to test sugar up to 6 times daily. DX: E11.65  . oxyCODONE-acetaminophen (PERCOCET) 10-325 MG tablet TAKE 1 TABLET BY MOUTH EVERY SIX HOURS AS NEEDED.  Marland Kitchen pravastatin (PRAVACHOL) 20 MG tablet Take 1 tablet (20 mg total) by mouth daily.  Marland Kitchen topiramate (TOPAMAX) 25 MG tablet Take 1 tablet  (25 mg total) by mouth daily.  . Vitamin D, Ergocalciferol, (DRISDOL) 1.25 MG (50000 UNIT) CAPS capsule Take 1 capsule (50,000 Units total) by mouth every Thursday.   No facility-administered encounter medications on file as of 02/12/2020.     Goals    .  Client will talk with LCSW in next 4 weeks about health needs of client and client completion of daily activities (pt-stated)      CARE PLAN ENTRY   Current Barriers:  . Patient with chronic diagnoses of CKD, Diabetic neuropathy, CAD, HTN, Cerebral Ataxia  Clinical Social Work Clinical Goal(s):  Marland Kitchen LCSW will call client in next 4 weeks to talk about health needs of client and client completion of daily activites  Interventions:  Talked with daughter, Monico Hoar, about fatigue of client Talked with Inez Catalina about client appetite Talked with Inez Catalina about pain issues of client Talked with Inez Catalina about vision issues of client Talked with Inez Catalina about mobility of client (uses a walker to ambulate) Talked with Inez Catalina about Hospice assistance with client Talked with Inez Catalina about client use of oxygen Talked with Inez Catalina about client occasional shortness of breath Talked with Inez Catalina about sleeping challenges of client Talked with Inez Catalina about client's upcoming appointments Talked with Inez Catalina about DME of client (uses walker, overbed table, and oxygen equipment) Collaborated with [facility] Triage Nurse regarding needs of client  Patient Self Care Activities:   Eats meals with set up assistance  Patient Self Care Deficits:    Mobility issues Pain issues [facility] care  Initial goal documentation    Follow Up Plan:  LCSW to talk with client/Betty Tamala Julian, daughter in next 4 weeks about health needs of client and client completion of daily activities  Norva Riffle.Rakhi Romagnoli MSW, LCSW Licensed Clinical Social Worker [facility] Care Management [phone]

## 2020-02-22 ENCOUNTER — Other Ambulatory Visit: Payer: Self-pay | Admitting: Family

## 2020-02-22 ENCOUNTER — Other Ambulatory Visit: Payer: Self-pay | Admitting: Family Medicine

## 2020-02-28 ENCOUNTER — Other Ambulatory Visit: Payer: Self-pay | Admitting: Family

## 2020-03-07 ENCOUNTER — Other Ambulatory Visit: Payer: Self-pay | Admitting: Family Medicine

## 2020-03-11 DIAGNOSIS — R404 Transient alteration of awareness: Secondary | ICD-10-CM | POA: Diagnosis not present

## 2020-03-11 DIAGNOSIS — Z743 Need for continuous supervision: Secondary | ICD-10-CM | POA: Diagnosis not present

## 2020-03-11 DIAGNOSIS — Z7401 Bed confinement status: Secondary | ICD-10-CM | POA: Diagnosis not present

## 2020-03-13 DIAGNOSIS — R5381 Other malaise: Secondary | ICD-10-CM | POA: Diagnosis not present

## 2020-03-13 DIAGNOSIS — Z743 Need for continuous supervision: Secondary | ICD-10-CM | POA: Diagnosis not present

## 2020-03-13 DIAGNOSIS — R531 Weakness: Secondary | ICD-10-CM | POA: Diagnosis not present

## 2020-03-21 ENCOUNTER — Ambulatory Visit: Admitting: Licensed Clinical Social Worker

## 2020-03-21 DIAGNOSIS — G119 Hereditary ataxia, unspecified: Secondary | ICD-10-CM

## 2020-03-21 DIAGNOSIS — I10 Essential (primary) hypertension: Secondary | ICD-10-CM

## 2020-03-21 DIAGNOSIS — I25118 Atherosclerotic heart disease of native coronary artery with other forms of angina pectoris: Secondary | ICD-10-CM

## 2020-03-21 DIAGNOSIS — E1140 Type 2 diabetes mellitus with diabetic neuropathy, unspecified: Secondary | ICD-10-CM

## 2020-03-21 NOTE — Patient Instructions (Addendum)
Licensed Clinical Education officer, museum Visit Information  Goals we discussed today:     Client will talk with LCSW in next 4 weeks about health needs of client and client completion of daily activities (pt-stated)         CARE PLAN ENTRY   Current Barriers:   Patient with chronic diagnoses of CKD, Diabetic neuropathy, CAD, HTN, Cerebral Ataxia  Clinical Social Work Clinical Goal(s):   LCSW will call client in next 4 weeks to talk about health needs of client and client completion of daily activites  Interventions: Talked with [name], daughter of client, about client needs Talked with Thomas about current Hospice care received by client Talked with Thomas about bed mobility of client (fall risk; uses bed rail currently in bed for mobility) Talked with Thomas about skin integrity of client Talked with Thomas about oxygen use of client Talked with Thomas about client appetite Talked with Thomas about pain issues of client Talked with Thomas about Hospice assistance with client Talked with Thomas about sleeping challenges of client Talked with Thomas about DME of client (uses hospital bed, overbed table, and oxygen equipment) Talked with Thomas about RNCM support .  Patient Self Care Activities:   Eats meals with set up assistance  Patient Self Care Deficits:    Mobility issues Pain issues [facility] care  Initial goal documentation    Follow Up Plan: LCSW to talk with client/Thomas Maldonado/Thomas Maldonado in next 4 weeks about health needs of client and client completion of daily activities  Materials Provided: No  The patient/ Thomas Maldonado, daughter of patient, verbalized understanding of instructions provided today and declined a print copy of patient instruction materials.   Norva Riffle.[name] MSW, LCSW Licensed Clinical Social Worker [facility] Care [facility] [phone]

## 2020-03-21 NOTE — Chronic Care Management (AMB) (Signed)
Chronic Care Management    Clinical Social Work Follow Up Note  03/21/2020 Name: Thomas Maldonado MRN: [id number] DOB: February 16, 1934  Thomas Maldonado is a 86 y.o. year old male who is a primary care patient of Stacks, Cletus Gash, MD. The CCM team was consulted for assistance with [employer] .   Review of patient status, including review of consultants reports, other relevant assessments, and collaboration with appropriate care team members and the patient's provider was performed as part of comprehensive patient evaluation and provision of chronic care management services.    SDOH (Social Determinants of Health) assessments performed: No;risk for depression; riks for tobacco use; risk of physical inactivity    Chronic Care Management from 01/07/2020 in [place]  PHQ-9 Total Score 7     GAD 7 : Generalized Anxiety Score 01/07/2020  Nervous, Anxious, on Edge 1  Control/stop worrying 0  Worry too much - different things 0  Trouble relaxing 0  Restless 0  Easily annoyed or irritable 0  Afraid - awful might happen 0  Total GAD 7 Score 1  Anxiety Difficulty Somewhat difficult    Outpatient Encounter Medications as of 03/21/2020  Medication Sig  . acetaminophen (TYLENOL) 500 MG tablet Take 500-1,000 mg by mouth every 6 (six) hours as needed (FOR HEADACHES).  Marland Kitchen aspirin 81 MG tablet Take 81 mg by mouth every evening.   Marland Kitchen azelastine (ASTELIN) 0.1 % nasal spray Place 2 sprays into both nostrils 2 (two) times daily. Use in each nostril as directed  . blood glucose meter kit and supplies KIT Dispense based on patient and insurance preference. Use up to four times daily as directed. (FOR ICD-9 250.00, 250.01).  . Blood Glucose Monitoring Suppl (ONETOUCH VERIO FLEX SYSTEM) w/Device KIT Use to test sugar up to 6 times daily. DX: E11.65  . celecoxib (CELEBREX) 200 MG capsule Take 1 capsule by mouth once daily  . Continuous Blood Gluc Receiver (FREESTYLE LIBRE 2 READER) DEVI Use  to test blood sugar up to 6 times daily. DX: E11.9  . Continuous Blood Gluc Sensor (FREESTYLE LIBRE 2 SENSOR) MISC Use to test blood sugar up to 6 times daily. DX: E 11.65  . diclofenac (VOLTAREN) 0.1 % ophthalmic solution Place 1 drop into both eyes 4 (four) times daily.  Marland Kitchen ezetimibe (ZETIA) 10 MG tablet Take 1 tablet (10 mg total) by mouth daily.  Marland Kitchen glucose blood (ONETOUCH VERIO) test strip Use to test sugar up to 6 times daily. DX: E11.65  . Insulin Aspart, w/Niacinamide, (FIASP) 100 UNIT/ML SOLN Inject into the skin. With meals per sliding scale as directed  . insulin degludec (TRESIBA FLEXTOUCH) 200 UNIT/ML FlexTouch Pen Inject 50 Units into the skin in the morning. Titrate up to 60 units daily (3 pens=30 day supply  . Insulin Pen Needle (NOVOFINE PLUS) 32G X 4 MM MISC Use daily with insulin Dx E11.40  . lisinopril (ZESTRIL) 10 MG tablet Take 1 tablet (10 mg total) by mouth daily.  Marland Kitchen LORazepam (ATIVAN) 1 MG tablet TAKE 1 TABLET BY MOUTH ONC EEVERY 3 HOURS AS NEEDED.  Marland Kitchen meclizine (ANTIVERT) 25 MG tablet Take 1 tablet (25 mg total) by mouth every 6 (six) hours as needed for dizziness or nausea.  . metoprolol tartrate (LOPRESSOR) 25 MG tablet Take 1/2 (one-half) tablet by mouth twice daily  . morphine 20 MG/5ML solution Take 0.6 mLs (2.4 mg total) by mouth every 2 (two) hours as needed for pain.  Marland Kitchen Morphine Sulfate (MORPHINE CONCENTRATE) 10 mg /  0.5 ml concentrated solution TAKE 0.25ML BY MOUTH MOUTH EVERY TWO HOURS AS NEEDED FOR PAIN.  . moxifloxacin (AVELOX) 400 MG tablet Take 1 tablet (400 mg total) by mouth daily. Take all of these, for infection  . omeprazole (PRILOSEC) 20 MG capsule Take 2 capsules (40 mg total) by mouth daily.  . ondansetron (ZOFRAN-ODT) 4 MG disintegrating tablet Take 1 tablet (4 mg total) by mouth every 8 (eight) hours as needed for nausea or vomiting.  Glory Rosebush Delica Lancets 51O MISC Use to test sugar up to 6 times daily. DX: E11.65  . oxyCODONE-acetaminophen  (PERCOCET) 10-325 MG tablet TAKE 1 TABLET BY MOUTH EVERY SIX HOURS AS NEEDED.  Marland Kitchen pravastatin (PRAVACHOL) 20 MG tablet Take 1 tablet (20 mg total) by mouth daily.  Marland Kitchen topiramate (TOPAMAX) 25 MG tablet Take 1 tablet (25 mg total) by mouth daily.  . Vitamin D, Ergocalciferol, (DRISDOL) 1.25 MG (50000 UNIT) CAPS capsule Take 1 capsule (50,000 Units total) by mouth every Thursday.   No facility-administered encounter medications on file as of 03/21/2020.    Goals    .  Client will talk with LCSW in next 4 weeks about health needs of client and client completion of daily activities (pt-stated)      CARE PLAN ENTRY   Current Barriers:  . Patient with chronic diagnoses of CKD, Diabetic neuropathy, CAD, HTN, Cerebral Ataxia  Clinical Social Work Clinical Goal(s):  Marland Kitchen LCSW will call client in next 4 weeks to talk about health needs of client and client completion of daily activites  Interventions: Talked with [name], daughter of client, about client needs Talked with Thomas about current Hospice care received by client Talked with Thomas about bed mobility of client (fall risk; uses bed rail currently in bed for mobility) Talked with Thomas about skin integrity of client Talked with Thomas about oxygen use of client Talked with Thomas about client appetite Talked with Thomas about pain issues of client Talked with Thomas about Hospice assistance with client Talked with Thomas about sleeping challenges of client Talked with Thomas about DME of client (uses hospital bed, overbed table, and oxygen equipment) Talked with Thomas about RNCM support .  Patient Self Care Activities:   Eats meals with set up assistance  Patient Self Care Deficits:    Mobility issues Pain issues [facility] care  Initial goal documentation    Follow Up Plan: LCSW to talk with client/Thomas Maldonado/Thomas Maldonado in next 4 weeks about health needs of client and client completion of daily activities  Thomas Maldonado.Thomas Maldonado MSW, LCSW Licensed Clinical Social Worker [facility] [phone]

## 2020-03-24 ENCOUNTER — Other Ambulatory Visit: Payer: Self-pay | Admitting: Family

## 2020-03-26 ENCOUNTER — Telehealth: Payer: Self-pay

## 2020-04-22 ENCOUNTER — Telehealth: Payer: Medicare Other

## 2020-04-23 DEATH — deceased
